# Patient Record
Sex: Male | Born: 1945
Health system: Southern US, Community
[De-identification: ages and names within clinical notes are randomized; demographics above are authoritative.]

## PROBLEM LIST (undated history)

## (undated) DIAGNOSIS — K219 Gastro-esophageal reflux disease without esophagitis: Secondary | ICD-10-CM

## (undated) DIAGNOSIS — J449 Chronic obstructive pulmonary disease, unspecified: Secondary | ICD-10-CM

## (undated) DIAGNOSIS — N411 Chronic prostatitis: Secondary | ICD-10-CM

## (undated) DIAGNOSIS — M109 Gout, unspecified: Secondary | ICD-10-CM

## (undated) DIAGNOSIS — J45909 Unspecified asthma, uncomplicated: Secondary | ICD-10-CM

## (undated) DIAGNOSIS — I10 Essential (primary) hypertension: Secondary | ICD-10-CM

## (undated) DIAGNOSIS — N400 Enlarged prostate without lower urinary tract symptoms: Secondary | ICD-10-CM

## (undated) DIAGNOSIS — R972 Elevated prostate specific antigen [PSA]: Secondary | ICD-10-CM

## (undated) DIAGNOSIS — I671 Cerebral aneurysm, nonruptured: Secondary | ICD-10-CM

## (undated) HISTORY — DX: Essential (primary) hypertension: I10

## (undated) HISTORY — DX: Gastro-esophageal reflux disease without esophagitis: K21.9

## (undated) HISTORY — DX: Elevated prostate specific antigen (PSA): R97.20

## (undated) HISTORY — DX: Cerebral aneurysm, nonruptured: I67.1

## (undated) HISTORY — DX: Chronic prostatitis: N41.1

## (undated) HISTORY — PX: LUNG SURGERY: SHX703

## (undated) HISTORY — DX: Benign prostatic hyperplasia without lower urinary tract symptoms: N40.0

## (undated) HISTORY — DX: Unspecified asthma, uncomplicated: J45.909

## (undated) HISTORY — PX: BRAIN SURGERY: SHX531

## (undated) HISTORY — PX: CARDIAC CATHETERIZATION: SHX172

---

## 2006-07-01 ENCOUNTER — Emergency Department: Payer: Self-pay

## 2007-05-19 ENCOUNTER — Emergency Department: Payer: Self-pay | Admitting: Emergency Medicine

## 2007-05-19 ENCOUNTER — Other Ambulatory Visit: Payer: Self-pay

## 2007-06-14 ENCOUNTER — Ambulatory Visit: Payer: Self-pay | Admitting: Gastroenterology

## 2008-04-12 ENCOUNTER — Ambulatory Visit: Payer: Self-pay | Admitting: Cardiology

## 2010-08-26 ENCOUNTER — Ambulatory Visit: Payer: Self-pay | Admitting: Gastroenterology

## 2012-03-18 ENCOUNTER — Emergency Department: Payer: Self-pay | Admitting: Emergency Medicine

## 2015-08-27 ENCOUNTER — Encounter: Payer: Self-pay | Admitting: *Deleted

## 2015-09-06 ENCOUNTER — Encounter: Payer: Self-pay | Admitting: Urology

## 2015-09-06 ENCOUNTER — Ambulatory Visit: Payer: Self-pay | Admitting: Urology

## 2015-09-13 ENCOUNTER — Ambulatory Visit (INDEPENDENT_AMBULATORY_CARE_PROVIDER_SITE_OTHER): Payer: Medicare Other | Admitting: Obstetrics and Gynecology

## 2015-09-13 ENCOUNTER — Encounter: Payer: Self-pay | Admitting: Obstetrics and Gynecology

## 2015-09-13 VITALS — BP 130/76 | HR 60 | Ht 69.0 in | Wt 190.6 lb

## 2015-09-13 DIAGNOSIS — R972 Elevated prostate specific antigen [PSA]: Secondary | ICD-10-CM | POA: Diagnosis not present

## 2015-09-13 DIAGNOSIS — K219 Gastro-esophageal reflux disease without esophagitis: Secondary | ICD-10-CM | POA: Insufficient documentation

## 2015-09-13 DIAGNOSIS — J449 Chronic obstructive pulmonary disease, unspecified: Secondary | ICD-10-CM | POA: Insufficient documentation

## 2015-09-13 DIAGNOSIS — N400 Enlarged prostate without lower urinary tract symptoms: Secondary | ICD-10-CM | POA: Diagnosis not present

## 2015-09-13 LAB — URINALYSIS, COMPLETE
BILIRUBIN UA: NEGATIVE
Glucose, UA: NEGATIVE
Ketones, UA: NEGATIVE
LEUKOCYTES UA: NEGATIVE
Nitrite, UA: NEGATIVE
PH UA: 5 (ref 5.0–7.5)
Protein, UA: NEGATIVE
RBC UA: NEGATIVE
Specific Gravity, UA: 1.015 (ref 1.005–1.030)
UUROB: 0.2 mg/dL (ref 0.2–1.0)

## 2015-09-13 LAB — MICROSCOPIC EXAMINATION
Bacteria, UA: NONE SEEN
RBC, UA: NONE SEEN /hpf (ref 0–?)
Renal Epithel, UA: NONE SEEN /hpf

## 2015-09-13 LAB — BLADDER SCAN AMB NON-IMAGING: SCAN RESULT: 111

## 2015-09-13 NOTE — Progress Notes (Signed)
Bladder Scan Patient  void: 111 ml Performed By: Pervis Hocking, CMA

## 2015-09-13 NOTE — Progress Notes (Signed)
09/13/2015 8:40 AM   Anthony Meyers September 29, 1946 [id number]  Referring provider: Maryland Pink, MD [address] [place], [place] [address]  Chief Complaint  Patient presents with  . Elevated PSA    HPI: Patient is a 69 year old male with a history of elevated PSA an BPH presenting today for his annual follow-up appointment.  His last appointment was 09/06/15 with Dr. Elnoria Howard. His PSA at that time was 7.4. Per Dr. Guinevere Ferrari last note patient was to have 4k score drawn if PSA was above 5.  It is unclear why patient did not have this done.  He does have a history of negative prostate biopsy on 08/22/13 for a PSA of 4.15.  No urinary complaints.  PSA History: 09/05/14 PSA 7.4 08/22/13 Negative prostate biopsy 07/20/13 PSA 4.15   PMH: Past Medical History  Diagnosis Date  . Brain aneurysm   . Asthma   . HTN (hypertension)   . GERD (gastroesophageal reflux disease)   . BPH (benign prostatic hypertrophy)   . Elevated PSA   . Chronic prostatitis     Surgical History: Past Surgical History  Procedure Laterality Date  . Lung surgery Left   . Brain surgery      Home Medications:    Medication List       This list is accurate as of: 09/13/15 11:59 PM.  Always use your most recent med list.               aspirin EC 81 MG tablet  Take 81 mg by mouth daily.     budesonide-formoterol 160-4.5 MCG/ACT inhaler  Commonly known as:  SYMBICORT  Inhale 2 puffs into the lungs 2 (two) times daily.     esomeprazole 40 MG capsule  Commonly known as:  NEXIUM  Take 40 mg by mouth daily at 12 noon.     hydrochlorothiazide 25 MG tablet  Commonly known as:  HYDRODIURIL  Take 25 mg by mouth daily.     INCRUSE ELLIPTA 62.5 MCG/INH Aepb  Generic drug:  Umeclidinium Bromide  INHALE 1 PUFFS BY MOUTH ONCE DAILY     potassium chloride SA 20 MEQ tablet  Commonly known as:  K-DUR,KLOR-CON     tiotropium 18 MCG inhalation capsule  Commonly known as:  SPIRIVA  Place 18 mcg  into inhaler and inhale daily.        Allergies: No Known Allergies  Family History: Family History  Problem Relation Age of Onset  . Throat cancer Brother   . Stroke Father   . Diabetes Mellitus II Father   . Prostate cancer Brother   . Hypertension Father     Social History:  reports that he quit smoking about 20 years ago. He does not have any smokeless tobacco history on file. He reports that he does not drink alcohol. His drug history is not on file.  ROS: UROLOGY Frequent Urination?: No Hard to postpone urination?: No Burning/pain with urination?: No Get up at night to urinate?: No Leakage of urine?: No Urine stream starts and stops?: No Trouble starting stream?: No Do you have to strain to urinate?: No Blood in urine?: No Urinary tract infection?: No Sexually transmitted disease?: No Injury to kidneys or bladder?: No Painful intercourse?: No Weak stream?: No Erection problems?: No Penile pain?: No  Gastrointestinal Nausea?: No Vomiting?: No Indigestion/heartburn?: No Diarrhea?: No Constipation?: No  Constitutional Fever: No Night sweats?: No Weight loss?: No Fatigue?: No  Skin Skin rash/lesions?: No Itching?: Yes  Eyes Blurred vision?: No Double vision?: No  Ears/Nose/Throat Sore throat?: No Sinus problems?: No  Hematologic/Lymphatic Swollen glands?: No Easy bruising?: No  Cardiovascular Leg swelling?: No Chest pain?: No  Respiratory Cough?: Yes Shortness of breath?: Yes  Endocrine Excessive thirst?: No  Musculoskeletal Back pain?: No Joint pain?: No  Neurological Headaches?: No Dizziness?: No  Psychologic Depression?: No Anxiety?: No  Physical Exam: BP 130/76 mmHg  Pulse 60  Ht 5\' 9"  (1.753 m)  Wt 190 lb 9.6 oz (86.456 kg)  BMI 28.13 kg/m2  Constitutional:  Alert and oriented, No acute distress. HEENT: [place] AT, moist mucus membranes.  Trachea midline, no masses. Cardiovascular: No clubbing, cyanosis, or  edema. Respiratory: Normal respiratory effort, no increased work of breathing. GI: Abdomen is soft, nontender, nondistended, no abdominal masses GU: No CVA tenderness.  DRE: prostate +2-3 with firmness and slightly irregularity noted on right side Skin: No rashes, bruises or suspicious lesions. Lymph: No cervical or inguinal adenopathy. Neurologic: Grossly intact, no focal deficits, moving all 4 extremities. Psychiatric: Normal mood and affect.  Laboratory Data: No results found for: WBC, HGB, HCT, MCV, PLT  No results found for: CREATININE  Lab Results  Component Value Date   PSA 5.3* 09/13/2015    No results found for: TESTOSTERONE  No results found for: HGBA1C  Urinalysis    Component Value Date/Time   GLUCOSEU Negative 09/13/2015 1622   BILIRUBINUR Negative 09/13/2015 1622   NITRITE Negative 09/13/2015 1622   LEUKOCYTESUR Negative 09/13/2015 1622    Pertinent Imaging:  Assessment & Plan:    1. Elevated PSA- PSA drawn today. If PSA greater than 5 we'll proceed with initial plan to have 4K score drawn.  DRE remarkable for enlarged prostate with slight right-sided irregularity. Patient has a history of negative prostate biopsy in 2014.  We will begin biannual screening with PSA and DRE pending today's PSA results.  2 BPH-  PVR 111 mls today. Patient denies any urinary complaints including intermittency, hesitancy, frequency or sensation of incomplete bladder emptying. We will continue to monitor and treat symptoms should they develop.  There are no diagnoses linked to this encounter.  Return in about 6 months (around 03/13/2016) for recheck DRE/PSA.  Herbert Moors, [facility] [address], [place], [place] [address] [phone]

## 2015-09-14 ENCOUNTER — Telehealth: Payer: Self-pay

## 2015-09-14 LAB — PSA: Prostate Specific Ag, Serum: 5.3 ng/mL — ABNORMAL HIGH (ref 0.0–4.0)

## 2015-09-14 NOTE — Telephone Encounter (Signed)
Spoke with pt in reference to PSA levels. Pt will RTC Monday for 4K score lab draw.

## 2015-09-14 NOTE — Telephone Encounter (Signed)
-----   Message from Roda Shutters, [place] sent at 09/14/2015  9:04 AM EDT ----- Please notify patient that his PSA was slightly elevated from what it was when he had the previous negative prostate biopsy. I would like for him to come in and have a 4k score drawn as we spoke about during his last appointment. Please order this and have him scheduled for a lab draw. Thanks

## 2015-09-17 ENCOUNTER — Encounter: Payer: Self-pay | Admitting: Obstetrics and Gynecology

## 2015-09-17 ENCOUNTER — Ambulatory Visit: Payer: Medicare Other

## 2015-09-17 DIAGNOSIS — R972 Elevated prostate specific antigen [PSA]: Secondary | ICD-10-CM

## 2015-09-17 NOTE — Progress Notes (Signed)
Pt came in today for 4K Score lab draw. 2 tiger tubes were drawn from pt right ac. Pt tolerated well. No s/s of adverse reaction noted. Blood spun, packaged, and sent via FedEx to [facility].

## 2015-09-20 ENCOUNTER — Telehealth: Payer: Self-pay | Admitting: Obstetrics and Gynecology

## 2015-09-20 NOTE — Telephone Encounter (Signed)
Please notify patient that we received his 4K results. He has approximately only a 2% chance of having aggressive prostate cancer if we performed a prostate biopsy. This is very low risk. It is reassuring that he does not have high-risk prostate cancer. He needs to keep his follow-up appointment as scheduled.  thanks

## 2015-09-21 ENCOUNTER — Other Ambulatory Visit: Payer: Self-pay | Admitting: Obstetrics and Gynecology

## 2015-09-21 NOTE — Telephone Encounter (Signed)
No answer

## 2015-09-24 NOTE — Telephone Encounter (Signed)
Spoke with pt in reference to 4K Score results. Pt voiced understanding.

## 2015-12-04 ENCOUNTER — Ambulatory Visit: Payer: Medicare Other | Admitting: Anesthesiology

## 2015-12-04 ENCOUNTER — Encounter
Admission: RE | Disposition: A | Discharge status: Home / Self Care | Payer: Self-pay | Source: Ambulatory Visit | Attending: Gastroenterology

## 2015-12-04 ENCOUNTER — Encounter: Payer: Self-pay | Admitting: Anesthesiology

## 2015-12-04 ENCOUNTER — Ambulatory Visit
Admission: RE | Admit: 2015-12-04 | Discharge: 2015-12-04 | Disposition: A | Discharge status: Home / Self Care | Payer: Medicare Other | Source: Ambulatory Visit | Attending: Gastroenterology | Admitting: Gastroenterology

## 2015-12-04 DIAGNOSIS — Z87891 Personal history of nicotine dependence: Secondary | ICD-10-CM | POA: Diagnosis not present

## 2015-12-04 DIAGNOSIS — I10 Essential (primary) hypertension: Secondary | ICD-10-CM | POA: Insufficient documentation

## 2015-12-04 DIAGNOSIS — K640 First degree hemorrhoids: Secondary | ICD-10-CM | POA: Insufficient documentation

## 2015-12-04 DIAGNOSIS — Z79899 Other long term (current) drug therapy: Secondary | ICD-10-CM | POA: Diagnosis not present

## 2015-12-04 DIAGNOSIS — K5730 Diverticulosis of large intestine without perforation or abscess without bleeding: Secondary | ICD-10-CM | POA: Insufficient documentation

## 2015-12-04 DIAGNOSIS — Z1211 Encounter for screening for malignant neoplasm of colon: Secondary | ICD-10-CM | POA: Insufficient documentation

## 2015-12-04 DIAGNOSIS — J449 Chronic obstructive pulmonary disease, unspecified: Secondary | ICD-10-CM | POA: Diagnosis not present

## 2015-12-04 DIAGNOSIS — Z7982 Long term (current) use of aspirin: Secondary | ICD-10-CM | POA: Diagnosis not present

## 2015-12-04 DIAGNOSIS — K219 Gastro-esophageal reflux disease without esophagitis: Secondary | ICD-10-CM | POA: Diagnosis not present

## 2015-12-04 DIAGNOSIS — Z7951 Long term (current) use of inhaled steroids: Secondary | ICD-10-CM | POA: Insufficient documentation

## 2015-12-04 HISTORY — PX: COLONOSCOPY WITH PROPOFOL: SHX5780

## 2015-12-04 SURGERY — COLONOSCOPY WITH PROPOFOL
Anesthesia: General

## 2015-12-04 MED ORDER — EPHEDRINE SULFATE 50 MG/ML IJ SOLN
INTRAMUSCULAR | Status: DC | PRN
  Administered 2015-12-04: 5 mg via INTRAVENOUS

## 2015-12-04 MED ORDER — FENTANYL CITRATE (PF) 100 MCG/2ML IJ SOLN
INTRAMUSCULAR | Status: DC | PRN
  Administered 2015-12-04: 50 ug via INTRAVENOUS

## 2015-12-04 MED ORDER — PROPOFOL 500 MG/50ML IV EMUL
INTRAVENOUS | Status: DC | PRN
  Administered 2015-12-04: 120 ug/kg/min via INTRAVENOUS

## 2015-12-04 MED ORDER — MIDAZOLAM HCL 2 MG/2ML IJ SOLN
INTRAMUSCULAR | Status: DC | PRN
  Administered 2015-12-04: 1 mg via INTRAVENOUS

## 2015-12-04 MED ORDER — SODIUM CHLORIDE 0.9 % IV SOLN
INTRAVENOUS | Status: DC
  Administered 2015-12-04: 1000 mL via INTRAVENOUS

## 2015-12-04 MED ORDER — SODIUM CHLORIDE 0.9 % IV SOLN: INTRAVENOUS | Status: DC

## 2015-12-04 NOTE — H&P (Signed)
Outpatient short stay form Pre-procedure 12/04/2015 2:01 PM Lollie Sails MD  Primary Physician: Dr Maryland Pink  Reason for visit:  Colonoscopy  History of present illness:  Patient is a 69 year old male presenting today for a colonoscopy. He has a personal history of adenomatous colon polyps. Last colonoscopy was 5 years ago. He tolerated his prep well. He takes no attending agents and held his 81 mg aspirin for several days.    Current facility-administered medications:  .  0.9 %  sodium chloride infusion, , Intravenous, Continuous, Lollie Sails, MD, Last Rate: 20 mL/hr at 12/04/15 1327, 1,000 mL at 12/04/15 1327 .  0.9 %  sodium chloride infusion, , Intravenous, Continuous, Lollie Sails, MD  Prescriptions prior to admission  Medication Sig Dispense Refill Last Dose  . aspirin EC 81 MG tablet Take 81 mg by mouth daily.   Past Week at Unknown time  . budesonide-formoterol (SYMBICORT) 160-4.5 MCG/ACT inhaler Inhale 2 puffs into the lungs 2 (two) times daily.   12/04/2015 at 730  . esomeprazole (NEXIUM) 40 MG capsule Take 40 mg by mouth daily at 12 noon.   12/03/2015 at 730  . hydrochlorothiazide (HYDRODIURIL) 25 MG tablet Take 25 mg by mouth daily.   12/04/2015 at 900  . potassium chloride SA (K-DUR,KLOR-CON) 20 MEQ tablet    12/03/2015 at 730  . tiotropium (SPIRIVA) 18 MCG inhalation capsule Place 18 mcg into inhaler and inhale daily.   12/03/2015 at 730  . Umeclidinium Bromide (INCRUSE ELLIPTA) 62.5 MCG/INH AEPB INHALE 1 PUFFS BY MOUTH ONCE DAILY   Past Week at Unknown time     No Known Allergies   Past Medical History  Diagnosis Date  . Brain aneurysm   . Asthma   . HTN (hypertension)   . GERD (gastroesophageal reflux disease)   . BPH (benign prostatic hypertrophy)   . Elevated PSA   . Chronic prostatitis     Review of systems:      Physical Exam    Heart and lungs: Regular rate and rhythm without rub or gallop, lungs are bilaterally clear.   HEENT: Normocephalic atraumatic eyes are anicteric    Other:     Pertinant exam for procedure: Off nontender nondistended bowel sounds positive normoactive.    Planned proceedures: Colonoscopy and indicated procedures. I have discussed the risks benefits and complications of procedures to include not limited to bleeding, infection, perforation and the risk of sedation and the patient wishes to proceed.    Lollie Sails, MD Gastroenterology 12/04/2015  2:01 PM

## 2015-12-04 NOTE — Anesthesia Postprocedure Evaluation (Signed)
Anesthesia Post Note  Patient: Anthony Meyers  Procedure(s) Performed: Procedure(s) (LRB): COLONOSCOPY WITH PROPOFOL (N/A)  Patient location during evaluation: PACU Anesthesia Type: General Level of consciousness: awake and alert Pain management: satisfactory to patient Vital Signs Assessment: post-procedure vital signs reviewed and stable Respiratory status: spontaneous breathing Cardiovascular status: stable Anesthetic complications: no    Last Vitals:  Filed Vitals:   12/04/15 1310  BP: 128/73  Pulse: 72  Temp: 36.2 C  Resp: 20    Last Pain: There were no vitals filed for this visit.               VAN STAVEREN,Danilyn Cocke

## 2015-12-04 NOTE — Op Note (Signed)
[facility] Gastroenterology Patient Name: Anthony Meyers Procedure Date: 12/04/2015 2:00 PM MRN: [id number] Account #: [id number] Date of Birth: 1946/02/27 Admit Type: Outpatient Age: 69 Room: [facility] ENDO ROOM 3 Gender: Male Note Status: Finalized Procedure:         Colonoscopy Indications:       Personal history of colonic polyps Providers:         Lollie Sails, MD Referring MD:      Irven Easterly. Kary Kos, MD (Referring MD) Medicines:         Monitored Anesthesia Care Complications:     No immediate complications. Procedure:         Pre-Anesthesia Assessment:                    - ASA Grade Assessment: III - A patient with severe                     systemic disease.                    After obtaining informed consent, the colonoscope was                     passed under direct vision. Throughout the procedure, the                     patient's blood pressure, pulse, and oxygen saturations                     were monitored continuously. The Colonoscope was                     introduced through the anus and advanced to the the cecum,                     identified by appendiceal orifice and ileocecal valve. The                     colonoscopy was performed with moderate difficulty due to                     a tortuous colon. The patient tolerated the procedure                     well. The quality of the bowel preparation was good. Findings:      Multiple medium-mouthed diverticula were found in the sigmoid colon and       in the descending colon.      Non-bleeding internal hemorrhoids were found during retroflexion and       during anoscopy. The hemorrhoids were Grade I (internal hemorrhoids that       do not prolapse).      The exam was otherwise without abnormality.      The digital rectal exam was normal. Impression:        - Diverticulosis in the sigmoid colon and in the                     descending colon.                    - Non-bleeding internal  hemorrhoids.                    - The examination was otherwise normal.                    -  No specimens collected. Recommendation:    - Repeat colonoscopy in 5 years for surveillance. Procedure Code(s): --- Professional ---                    [phone], Colonoscopy, flexible; diagnostic, including                     collection of specimen(s) by brushing or washing, when                     performed (separate procedure) Diagnosis Code(s): --- Professional ---                    K64.0, First degree hemorrhoids                    Z86.010, Personal history of colonic polyps                    K57.30, Diverticulosis of large intestine without                     perforation or abscess without bleeding CPT copyright 2014 American Medical Association. All rights reserved. The codes documented in this report are preliminary and upon coder review may  be revised to meet current compliance requirements. Lollie Sails, MD 12/04/2015 2:32:57 PM This report has been signed electronically. Number of Addenda: 0 Note Initiated On: 12/04/2015 2:00 PM Scope Withdrawal Time: 0 hours 5 minutes 59 seconds  Total Procedure Duration: 0 hours 18 minutes 35 seconds       [facility]

## 2015-12-04 NOTE — Transfer of Care (Signed)
Immediate Anesthesia Transfer of Care Note  Patient: Anthony Meyers  Procedure(s) Performed: Procedure(s): COLONOSCOPY WITH PROPOFOL (N/A)  Patient Location: PACU  Anesthesia Type:General  Level of Consciousness: awake and sedated  Airway & Oxygen Therapy: Patient Spontanous Breathing and Patient connected to nasal cannula oxygen  Post-op Assessment: Report given to RN and Post -op Vital signs reviewed and stable  Post vital signs: Reviewed and stable  Last Vitals:  Filed Vitals:   12/04/15 1310  BP: 128/73  Pulse: 72  Temp: 36.2 C  Resp: 20    Complications: No apparent anesthesia complications

## 2015-12-04 NOTE — Anesthesia Preprocedure Evaluation (Addendum)
Anesthesia Evaluation  Patient identified by MRN, date of birth, ID band Patient awake    Reviewed: Allergy & Precautions  Airway Mallampati: II       Dental  (+) Upper Dentures, Partial Lower   Pulmonary COPD, former smoker,     + decreased breath sounds      Cardiovascular hypertension, Pt. on medications  Rhythm:Regular     Neuro/Psych    GI/Hepatic Neg liver ROS, GERD  ,  Endo/Other  negative endocrine ROS  Renal/GU negative Renal ROS     Musculoskeletal negative musculoskeletal ROS (+)   Abdominal Normal abdominal exam  (+)   Peds  Hematology negative hematology ROS (+)   Anesthesia Other Findings   Reproductive/Obstetrics                            Anesthesia Physical Anesthesia Plan  ASA: III  Anesthesia Plan: General   Post-op Pain Management:    Induction: Intravenous  Airway Management Planned: Nasal Cannula  Additional Equipment:   Intra-op Plan:   Post-operative Plan:   Informed Consent: I have reviewed the patients History and Physical, chart, labs and discussed the procedure including the risks, benefits and alternatives for the proposed anesthesia with the patient or authorized representative who has indicated his/her understanding and acceptance.     Plan Discussed with: CRNA  Anesthesia Plan Comments:        Anesthesia Quick Evaluation

## 2015-12-04 NOTE — Anesthesia Procedure Notes (Signed)
Performed by: COOK-MARTIN, Terrian Ridlon Pre-anesthesia Checklist: Patient identified, Emergency Drugs available, Suction available, Patient being monitored and Timeout performed Patient Re-evaluated:Patient Re-evaluated prior to inductionOxygen Delivery Method: Nasal cannula Preoxygenation: Pre-oxygenation with 100% oxygen Intubation Type: IV induction Placement Confirmation: positive ETCO2 and CO2 detector       

## 2015-12-05 ENCOUNTER — Encounter: Payer: Self-pay | Admitting: Gastroenterology

## 2016-03-05 DIAGNOSIS — Z125 Encounter for screening for malignant neoplasm of prostate: Secondary | ICD-10-CM | POA: Diagnosis not present

## 2016-03-05 DIAGNOSIS — K219 Gastro-esophageal reflux disease without esophagitis: Secondary | ICD-10-CM | POA: Diagnosis not present

## 2016-03-05 DIAGNOSIS — I10 Essential (primary) hypertension: Secondary | ICD-10-CM | POA: Diagnosis not present

## 2016-03-05 DIAGNOSIS — J449 Chronic obstructive pulmonary disease, unspecified: Secondary | ICD-10-CM | POA: Diagnosis not present

## 2016-03-13 ENCOUNTER — Encounter: Payer: Self-pay | Admitting: Urology

## 2016-03-13 ENCOUNTER — Ambulatory Visit (INDEPENDENT_AMBULATORY_CARE_PROVIDER_SITE_OTHER): Payer: Medicare Other | Admitting: Urology

## 2016-03-13 VITALS — BP 114/57 | HR 58 | Ht 69.0 in | Wt 184.1 lb

## 2016-03-13 DIAGNOSIS — N401 Enlarged prostate with lower urinary tract symptoms: Secondary | ICD-10-CM

## 2016-03-13 DIAGNOSIS — N138 Other obstructive and reflux uropathy: Secondary | ICD-10-CM

## 2016-03-13 DIAGNOSIS — R972 Elevated prostate specific antigen [PSA]: Secondary | ICD-10-CM

## 2016-03-13 DIAGNOSIS — N400 Enlarged prostate without lower urinary tract symptoms: Secondary | ICD-10-CM | POA: Diagnosis not present

## 2016-03-13 NOTE — Progress Notes (Signed)
10:26 AM   Anthony Meyers [birth year] [id number]  Referring provider: Maryland Pink, MD [address] [facility] [place], [place] [address]  Chief Complaint  Patient presents with  . Benign Prostatic Hypertrophy    6 month follow up  . Elevated PSA    HPI: Patient is a 70 year old African-American male with a history of elevated PSA an BPH presenting today for his biannual follow-up appointment.    His last appointment with Dr. Elnoria Howard. His PSA at that time was 7.4. Per Dr. Guinevere Ferrari last note patient was to have 4k score drawn if PSA was above 5.  It is unclear why patient did not have this done.  He does have a history of negative prostate biopsy on 08/22/13 for a PSA of 4.15.  His most recent PSA was 5.3 on 09/13/2015.  His 4K score at that time was 2%.  No urinary complaints.  PSA History: 09/05/14 PSA 7.4 08/22/13 Negative prostate biopsy 07/20/13 PSA 4.15  IPSS score is 2/2.      IPSS      03/13/16 0900       International Prostate Symptom Score   How often have you had the sensation of not emptying your bladder? Not at All     How often have you had to urinate less than every two hours? Not at All     How often have you found you stopped and started again several times when you urinated? Not at All     How often have you found it difficult to postpone urination? Not at All     How often have you had a weak urinary stream? Not at All     How often have you had to strain to start urination? Not at All     How many times did you typically get up at night to urinate? 2 Times     Total IPSS Score 2     Quality of Life due to urinary symptoms   If you were to spend the rest of your life with your urinary condition just the way it is now how would you feel about that? Mostly Satisfied        Score:  1-7 Mild 8-19 Moderate 20-35 Severe  PMH: Past Medical History  Diagnosis Date  . Brain aneurysm   . Asthma   . HTN (hypertension)   . GERD (gastroesophageal  reflux disease)   . BPH (benign prostatic hypertrophy)   . Elevated PSA   . Chronic prostatitis     Surgical History: Past Surgical History  Procedure Laterality Date  . Lung surgery Left   . Brain surgery    . Colonoscopy with propofol N/A 12/04/2015    Procedure: COLONOSCOPY WITH PROPOFOL;  Surgeon: Lollie Sails, MD;  Location[facility] ENDOSCOPY;  Service: Endoscopy;  Laterality: N/A;    Home Medications:    Medication List       This list is accurate as of: 03/13/16 10:26 AM.  Always use your most recent med list.               aspirin EC 81 MG tablet  Take 81 mg by mouth daily.     budesonide-formoterol 160-4.5 MCG/ACT inhaler  Commonly known as:  SYMBICORT  Inhale 2 puffs into the lungs 2 (two) times daily.     esomeprazole 40 MG capsule  Commonly known as:  NEXIUM  Take 40 mg by mouth daily at 12 noon.  hydrochlorothiazide 25 MG tablet  Commonly known as:  HYDRODIURIL  Take 25 mg by mouth daily.     INCRUSE ELLIPTA 62.5 MCG/INH Aepb  Generic drug:  umeclidinium bromide  INHALE 1 PUFFS BY MOUTH ONCE DAILY     potassium chloride SA 20 MEQ tablet  Commonly known as:  K-DUR,KLOR-CON     tiotropium 18 MCG inhalation capsule  Commonly known as:  SPIRIVA  Place 18 mcg into inhaler and inhale daily.        Allergies: No Known Allergies  Family History: Family History  Problem Relation Age of Onset  . Throat cancer Brother   . Stroke Father   . Diabetes Mellitus II Father   . Prostate cancer Brother   . Hypertension Father   . Kidney disease Neg Hx     Social History:  reports that he quit smoking about 20 years ago. He does not have any smokeless tobacco history on file. He reports that he does not drink alcohol or use illicit drugs.  ROS: UROLOGY Frequent Urination?: No Hard to postpone urination?: No Burning/pain with urination?: No Get up at night to urinate?: No Leakage of urine?: No Urine stream starts and stops?: No Trouble starting  stream?: No Do you have to strain to urinate?: No Blood in urine?: No Urinary tract infection?: No Sexually transmitted disease?: No Injury to kidneys or bladder?: No Painful intercourse?: No Weak stream?: No Erection problems?: No Penile pain?: No  Gastrointestinal Nausea?: No Vomiting?: No Indigestion/heartburn?: No Diarrhea?: No Constipation?: No  Constitutional Fever: No Night sweats?: No Weight loss?: No Fatigue?: No  Skin Skin rash/lesions?: No Itching?: No  Eyes Blurred vision?: No Double vision?: No  Ears/Nose/Throat Sore throat?: No Sinus problems?: No  Hematologic/Lymphatic Swollen glands?: No Easy bruising?: No  Cardiovascular Leg swelling?: No Chest pain?: No  Respiratory Cough?: No Shortness of breath?: No  Endocrine Excessive thirst?: No  Musculoskeletal Back pain?: No Joint pain?: No  Neurological Headaches?: No Dizziness?: No  Psychologic Depression?: No Anxiety?: No  Physical Exam: BP 114/57 mmHg  Pulse 58  Ht 5\' 9"  (1.753 m)  Wt 184 lb 1.6 oz (83.507 kg)  BMI 27.17 kg/m2  GU: No CVA tenderness.  No bladder fullness or masses.  Patient with uncircumcised phallus. Foreskin easily retracted  Urethral meatus is patent.  No penile discharge. No penile lesions or rashes. Scrotum without lesions, cysts, rashes and/or edema.  Testicles are located scrotally bilaterally. No masses are appreciated in the testicles. Left and right epididymis are normal. Rectal: Patient with  normal sphincter tone. Anus and perineum without scarring or rashes. No rectal masses are appreciated. Prostate is approximately 50 grams, slightly firmer in the right lobe, no nodules are appreciated. Seminal vesicles are normal.   Laboratory Data: PSA History 09/13/2015 PSA 5.3; 4 K score 2% 09/05/14 PSA 7.4 08/22/13 Negative prostate biopsy 07/20/13 PSA 4.15    Assessment & Plan:    1. Elevated PSA- PSA drawn today.  DRE remarkable for enlarged prostate  with slight right-sided irregularity. Patient has a history of negative prostate biopsy in 2014.  We will continue biannual screening with PSA and DRE pending today's PSA results.  2 BPH with LUTS:    IPSS score is 2/2.  We will continue to monitor.  He will have his IPSS score, exam and PSA in 6 months.   Return in about 6 months (around 09/12/2016) for IPSS and exam.  Zara Council, PA-C  [facility] [address], [address]  [place], [place] [address] [phone]

## 2016-03-14 LAB — PSA: Prostate Specific Ag, Serum: 6.1 ng/mL — ABNORMAL HIGH (ref 0.0–4.0)

## 2016-03-17 ENCOUNTER — Telehealth: Payer: Self-pay

## 2016-03-17 NOTE — Telephone Encounter (Signed)
-----   Message from Nori Riis, PA-C sent at 03/14/2016  3:23 PM EDT ----- Would you add a free and total PSA to his blood work?

## 2016-03-17 NOTE — Telephone Encounter (Signed)
Spoke with Loma Sousa at [employer] were added.

## 2016-03-18 LAB — PSA, TOTAL AND FREE
PSA, Free Pct: 11.6 %
PSA, Free: 0.73 ng/mL
Prostate Specific Ag, Serum: 6.3 ng/mL — ABNORMAL HIGH (ref 0.0–4.0)

## 2016-03-18 LAB — SPECIMEN STATUS REPORT

## 2016-03-25 ENCOUNTER — Telehealth: Payer: Self-pay

## 2016-03-25 NOTE — Telephone Encounter (Signed)
Pt called back saying he doesn't want to have the biopsy. He'd rather have "a TT something." I asked Vikki Ports if she knew what he was talking about and she's unsure. He wants Larene Beach to know that he'd rather have the alternative option and he can't have it on Monday. Please give him a call regarding this.  Pt's ph# [phone] Thank you.

## 2016-03-25 NOTE — Telephone Encounter (Signed)
Spoke with pt in reference to blood work results. Pt did want to proceed with biopsy. Should we go ahead and proceed with scheduling?

## 2016-03-25 NOTE — Telephone Encounter (Signed)
-----   Message from Nori Riis, PA-C sent at 03/24/2016  1:17 PM EDT ----- Patient's blood work indicates that he has a 35% probability of having prostate cancer.  Could elect to undergo another biopsy at this time before we can schedule MRI of the prostate.

## 2016-03-25 NOTE — Telephone Encounter (Signed)
Does he mean the MRI?

## 2016-03-27 NOTE — Telephone Encounter (Signed)
No answer

## 2016-04-04 NOTE — Telephone Encounter (Signed)
No answer

## 2016-09-10 DIAGNOSIS — Z23 Encounter for immunization: Secondary | ICD-10-CM | POA: Diagnosis not present

## 2016-09-10 DIAGNOSIS — J449 Chronic obstructive pulmonary disease, unspecified: Secondary | ICD-10-CM | POA: Diagnosis not present

## 2016-09-10 DIAGNOSIS — I10 Essential (primary) hypertension: Secondary | ICD-10-CM | POA: Diagnosis not present

## 2016-09-10 DIAGNOSIS — Z0000 Encounter for general adult medical examination without abnormal findings: Secondary | ICD-10-CM | POA: Diagnosis not present

## 2016-09-10 DIAGNOSIS — K219 Gastro-esophageal reflux disease without esophagitis: Secondary | ICD-10-CM | POA: Diagnosis not present

## 2016-09-15 ENCOUNTER — Other Ambulatory Visit: Payer: Medicare Other

## 2016-09-17 ENCOUNTER — Ambulatory Visit (INDEPENDENT_AMBULATORY_CARE_PROVIDER_SITE_OTHER): Payer: Medicare Other | Admitting: Urology

## 2016-09-17 ENCOUNTER — Encounter: Payer: Self-pay | Admitting: Urology

## 2016-09-17 VITALS — BP 134/67 | HR 72 | Ht 69.0 in | Wt 185.6 lb

## 2016-09-17 DIAGNOSIS — N138 Other obstructive and reflux uropathy: Secondary | ICD-10-CM

## 2016-09-17 DIAGNOSIS — N401 Enlarged prostate with lower urinary tract symptoms: Secondary | ICD-10-CM

## 2016-09-17 DIAGNOSIS — R972 Elevated prostate specific antigen [PSA]: Secondary | ICD-10-CM

## 2016-09-17 NOTE — Progress Notes (Signed)
10:22 AM   Anthony Meyers [birth year] [id number]  Referring provider: Maryland Pink, MD [address] [facility] [place], [place] [address]  Chief Complaint  Patient presents with  . Benign Prostatic Hypertrophy    6 month follow up  . Elevated PSA    HPI: Patient is a 70 year old African-American male with a history of elevated PSA an BPH presenting today for his biannual follow-up appointment.    History of elevated PSA Patient underwent a prostate biopsy in 2014 for a PSA of 4.5.  Prostate biopsy was negative.  His brother has a history of prostate cancer.  His most recent PSA was 6.1 on 03/13/2016.     BPH WITH LUTS His IPSS score today is 1, which is mild lower urinary tract symptomatology. He is pleased with his quality life due to his urinary symptoms.   His previous IPSS score was 2/2.  He has no urinary complaints at this time.  He denies any dysuria, hematuria or suprapubic pain.   He also denies any recent fevers, chills, nausea or vomiting.      IPSS    Row Name 09/17/16 0900         International Prostate Symptom Score   How often have you had the sensation of not emptying your bladder? Not at All     How often have you had to urinate less than every two hours? Not at All     How often have you found you stopped and started again several times when you urinated? Not at All     How often have you found it difficult to postpone urination? Not at All     How often have you had a weak urinary stream? Not at All     How often have you had to strain to start urination? Not at All     How many times did you typically get up at night to urinate? 1 Time     Total IPSS Score 1       Quality of Life due to urinary symptoms   If you were to spend the rest of your life with your urinary condition just the way it is now how would you feel about that? Pleased        Score:  1-7 Mild 8-19 Moderate 20-35 Severe   PMH: Past Medical History:  Diagnosis Date    . Asthma   . BPH (benign prostatic hypertrophy)   . Brain aneurysm   . Chronic prostatitis   . Elevated PSA   . GERD (gastroesophageal reflux disease)   . HTN (hypertension)     Surgical History: Past Surgical History:  Procedure Laterality Date  . BRAIN SURGERY    . COLONOSCOPY WITH PROPOFOL N/A 12/04/2015   Procedure: COLONOSCOPY WITH PROPOFOL;  Surgeon: Lollie Sails, MD;  Location: [facility] ENDOSCOPY;  Service: Endoscopy;  Laterality: N/A;  . LUNG SURGERY Left     Home Medications:    Medication List       Accurate as of 09/17/16 10:22 AM. Always use your most recent med list.          aspirin EC 81 MG tablet Take 81 mg by mouth daily.   budesonide-formoterol 160-4.5 MCG/ACT inhaler Commonly known as:  SYMBICORT Inhale 2 puffs into the lungs 2 (two) times daily.   esomeprazole 40 MG capsule Commonly known as:  NEXIUM Take 40 mg by mouth daily at 12 noon.   hydrochlorothiazide 25 MG tablet  Commonly known as:  HYDRODIURIL Take 25 mg by mouth daily.   INCRUSE ELLIPTA 62.5 MCG/INH Aepb Generic drug:  umeclidinium bromide INHALE 1 PUFFS BY MOUTH ONCE DAILY   potassium chloride SA 20 MEQ tablet Commonly known as:  K-DUR,KLOR-CON   tiotropium 18 MCG inhalation capsule Commonly known as:  SPIRIVA Place 18 mcg into inhaler and inhale daily.       Allergies: No Known Allergies  Family History: Family History  Problem Relation Age of Onset  . Throat cancer Brother   . Stroke Father   . Diabetes Mellitus II Father   . Hypertension Father   . Prostate cancer Brother   . Kidney disease Neg Hx     Social History:  reports that he quit smoking about 21 years ago. He has never used smokeless tobacco. He reports that he does not drink alcohol or use drugs.  ROS: UROLOGY Frequent Urination?: No Hard to postpone urination?: No Burning/pain with urination?: No Get up at night to urinate?: No Leakage of urine?: No Urine stream starts and stops?:  No Trouble starting stream?: No Do you have to strain to urinate?: No Blood in urine?: No Urinary tract infection?: No Sexually transmitted disease?: No Injury to kidneys or bladder?: No Painful intercourse?: No Weak stream?: No Erection problems?: No Penile pain?: No  Gastrointestinal Nausea?: No Vomiting?: No Indigestion/heartburn?: No Diarrhea?: No Constipation?: No  Constitutional Fever: No Night sweats?: No Weight loss?: Yes Fatigue?: No  Skin Skin rash/lesions?: No Itching?: Yes  Eyes Blurred vision?: Yes Double vision?: No  Ears/Nose/Throat Sore throat?: No Sinus problems?: No  Hematologic/Lymphatic Swollen glands?: No Easy bruising?: No  Cardiovascular Leg swelling?: No Chest pain?: Yes  Respiratory Cough?: No Shortness of breath?: No  Endocrine Excessive thirst?: No  Musculoskeletal Back pain?: No Joint pain?: Yes  Neurological Headaches?: No Dizziness?: No  Psychologic Depression?: No Anxiety?: No  Physical Exam: BP 134/67   Pulse 72   Ht 5\' 9"  (1.753 m)   Wt 185 lb 9.6 oz (84.2 kg)   BMI 27.41 kg/m   GU: No CVA tenderness.  No bladder fullness or masses.  Patient with uncircumcised phallus. Foreskin easily retracted  Urethral meatus is patent.  No penile discharge. No penile lesions or rashes. Scrotum without lesions, cysts, rashes and/or edema.  Testicles are located scrotally bilaterally. No masses are appreciated in the testicles. Left and right epididymis are normal.  Right hydrocele was noted.   Rectal: Patient with  normal sphincter tone. Anus and perineum without scarring or rashes. No rectal masses are appreciated. Prostate is approximately 50 grams, no nodules are appreciated. Seminal vesicles are normal.   Laboratory Data: PSA History  4.15 ng/mL on 07/20/2013  Negative prostate biopsy  7.4 ng/mL on 09/05/2014  5.3 ng/mL on 09/13/2015  4K was 2 % on 09/13/2015  6.1 ng/mL on 03/13/2016    PSA, Free  0.73 - 35%  probability of prostate cancer  Assessment & Plan:    1. Elevated PSA  - see trend above  - PSA drawn today  2. BPH with LUTS  - IPSS score is 1/1, it is stable  - Continue conservative management, avoiding bladder irritants and timed voiding's  - RTC in 6 months for IPSS, PSA and exam   Return in about 6 months (around 03/18/2017) for IPSS and exam.  Zara Council, [facility]  [facility] [address], [place], [place] [address] [phone]

## 2016-09-18 LAB — PSA: PROSTATE SPECIFIC AG, SERUM: 8.6 ng/mL — AB (ref 0.0–4.0)

## 2016-09-19 ENCOUNTER — Telehealth: Payer: Self-pay

## 2016-09-19 NOTE — Telephone Encounter (Signed)
-----   Message from Nori Riis, PA-C sent at 09/19/2016  8:20 AM EDT ----- Please add a free and total PSA to his blood work.

## 2016-09-19 NOTE — Telephone Encounter (Signed)
Labs were added.

## 2016-09-22 ENCOUNTER — Telehealth: Payer: Self-pay

## 2016-09-22 DIAGNOSIS — R972 Elevated prostate specific antigen [PSA]: Secondary | ICD-10-CM

## 2016-09-22 NOTE — Telephone Encounter (Signed)
Spoke with pt in reference to PSA and possible prostate cancer. Reinforced with pt if urinary symptoms worsen, hematuria, or bone pain to call back. Pt voiced understanding of whole conversation. Lab apt made and orders placed.

## 2016-09-22 NOTE — Telephone Encounter (Signed)
-----   Message from Nori Riis, PA-C sent at 09/21/2016  2:55 PM EDT ----- Patient's probability of prostate cancer at this time is 35%.  His PSA is still continuing to creep up which is concerning for a possible cancerous situation.  Current recommendations for gentleman at his age is not to undergo prostate biopsy until the PSA reaches levels over 10.  It should be noted at this time that treatment for any prostate cancer at this age is aimed toward palliative care and not a curative treatment.  It would be reasonable at this time to continue a 6 month follow-up unless he develops symptoms prior to that time such as worsening of his urinary symptoms, blood in his urine or bone pain.

## 2016-09-23 LAB — PSA, TOTAL AND FREE
PSA, Free Pct: 11.3 %
PSA, Free: 0.93 ng/mL
Prostate Specific Ag, Serum: 8.2 ng/mL — ABNORMAL HIGH (ref 0.0–4.0)

## 2016-09-23 LAB — SPECIMEN STATUS REPORT

## 2016-12-15 DIAGNOSIS — J449 Chronic obstructive pulmonary disease, unspecified: Secondary | ICD-10-CM | POA: Diagnosis not present

## 2016-12-15 DIAGNOSIS — R109 Unspecified abdominal pain: Secondary | ICD-10-CM | POA: Diagnosis not present

## 2017-03-11 ENCOUNTER — Other Ambulatory Visit: Payer: Medicare Other

## 2017-03-11 DIAGNOSIS — R972 Elevated prostate specific antigen [PSA]: Secondary | ICD-10-CM

## 2017-03-12 LAB — PSA: Prostate Specific Ag, Serum: 6 ng/mL — ABNORMAL HIGH (ref 0.0–4.0)

## 2017-03-18 ENCOUNTER — Ambulatory Visit: Payer: Medicare Other | Admitting: Urology

## 2017-03-24 ENCOUNTER — Other Ambulatory Visit: Payer: Medicare Other

## 2017-03-25 ENCOUNTER — Other Ambulatory Visit: Payer: Self-pay

## 2017-03-25 ENCOUNTER — Other Ambulatory Visit: Payer: Medicare Other

## 2017-03-30 NOTE — Progress Notes (Signed)
9:51 AM   Anthony Meyers 1946/09/30 [id number]  Referring provider: Maryland Pink, MD [address] [facility] [place], [place] [address]  Chief Complaint  Patient presents with  . Benign Prostatic Hypertrophy    6 month follow up  . Elevated PSA    HPI: Patient is a 71 year old African-American male with a history of elevated PSA an BPH presenting today for his biannual follow-up appointment.    History of elevated PSA Patient underwent a prostate biopsy in 2014 for a PSA of 4.5.  Prostate biopsy was negative.  His brother has a history of prostate cancer.  His most recent PSA was 6.0 ng/mL on 03/11/2017.      BPH WITH LUTS His IPSS score today is 4, which is mild lower urinary tract symptomatology. He is pleased with his quality life due to his urinary symptoms.   His previous IPSS score was 1/1.  He has no urinary complaints at this time.  He denies any dysuria, hematuria or suprapubic pain.   He also denies any recent fevers, chills, nausea or vomiting.      IPSS    Row Name 03/31/17 0900         International Prostate Symptom Score   How often have you had the sensation of not emptying your bladder? Not at All     How often have you had to urinate less than every two hours? Not at All     How often have you found you stopped and started again several times when you urinated? Not at All     How often have you found it difficult to postpone urination? Less than 1 in 5 times     How often have you had a weak urinary stream? Less than 1 in 5 times     How often have you had to strain to start urination? Not at All     How many times did you typically get up at night to urinate? 2 Times     Total IPSS Score 4       Quality of Life due to urinary symptoms   If you were to spend the rest of your life with your urinary condition just the way it is now how would you feel about that? Pleased        Score:  1-7 Mild 8-19 Moderate 20-35 Severe   PMH: Past  Medical History:  Diagnosis Date  . Asthma   . BPH (benign prostatic hypertrophy)   . Brain aneurysm   . Chronic prostatitis   . Elevated PSA   . GERD (gastroesophageal reflux disease)   . HTN (hypertension)     Surgical History: Past Surgical History:  Procedure Laterality Date  . BRAIN SURGERY    . COLONOSCOPY WITH PROPOFOL N/A 12/04/2015   Procedure: COLONOSCOPY WITH PROPOFOL;  Surgeon: Lollie Sails, MD;  Location[facility] END[facility]COPY;  Service: Endoscopy;  Laterality: N/A;  . LUNG SURGERY Left     Home Medications:  Allergies as of 03/31/2017   No Known Allergies     Medication List       Accurate as of 03/31/17  9:51 AM. Always use your most recent med list.          aspirin EC 81 MG tablet Take 81 mg by mouth daily.   budesonide-formoterol 160-4.5 MCG/ACT inhaler Commonly known as:  SYMBICORT Inhale 2 puffs into the lungs 2 (two) times daily.   esomeprazole 40 MG capsule Commonly  known as:  NEXIUM Take 40 mg by mouth daily at 12 noon.   hydrochlorothiazide 25 MG tablet Commonly known as:  HYDRODIURIL Take 25 mg by mouth daily.   INCRUSE ELLIPTA 62.5 MCG/INH Aepb Generic drug:  umeclidinium bromide INHALE 1 PUFFS BY MOUTH ONCE DAILY   potassium chloride SA 20 MEQ tablet Commonly known as:  K-DUR,KLOR-CON   tiotropium 18 MCG inhalation capsule Commonly known as:  SPIRIVA Place 18 mcg into inhaler and inhale daily.       Allergies: No Known Allergies  Family History: Family History  Problem Relation Age of Onset  . Throat cancer Brother   . Stroke Father   . Diabetes Mellitus II Father   . Hypertension Father   . Prostate cancer Brother   . Kidney disease Neg Hx   . Kidney cancer Neg Hx   . Bladder Cancer Neg Hx     Social History:  reports that he quit smoking about 21 years ago. He has never used smokeless tobacco. He reports that he does not drink alcohol or use drugs.  ROS: UROLOGY Frequent Urination?: No Hard to postpone  urination?: No Burning/pain with urination?: No Get up at night to urinate?: No Leakage of urine?: No Urine stream starts and stops?: No Trouble starting stream?: No Do you have to strain to urinate?: No Blood in urine?: No Urinary tract infection?: No Sexually transmitted disease?: No Injury to kidneys or bladder?: No Painful intercourse?: No Weak stream?: No Erection problems?: No Penile pain?: No  Gastrointestinal Nausea?: No Vomiting?: No Indigestion/heartburn?: No Diarrhea?: No Constipation?: No  Constitutional Fever: No Night sweats?: No Weight loss?: No Fatigue?: No  Skin Skin rash/lesions?: No Itching?: No  Eyes Blurred vision?: No Double vision?: No  Ears/Nose/Throat Sore throat?: No Sinus problems?: No  Hematologic/Lymphatic Swollen glands?: No Easy bruising?: No  Cardiovascular Leg swelling?: No Chest pain?: No  Respiratory Cough?: Yes Shortness of breath?: No  Endocrine Excessive thirst?: No  Musculoskeletal Back pain?: No Joint pain?: No  Neurological Headaches?: No Dizziness?: No  Psychologic Depression?: No Anxiety?: No  Physical Exam: BP (!) 142/68   Pulse (!) 53   Ht 5\' 9"  (1.753 m)   Wt 192 lb 14.4 oz (87.5 kg)   BMI 28.49 kg/m   GU: No CVA tenderness.  No bladder fullness or masses.  Patient with uncircumcised phallus. Foreskin easily retracted  Urethral meatus is patent.  No penile discharge. No penile lesions or rashes. Scrotum without lesions, cysts, rashes and/or edema.  Testicles are located scrotally bilaterally. No masses are appreciated in the testicles. Left and right epididymis are normal.  Right hydrocele was noted.   Rectal: Patient with  normal sphincter tone. Anus and perineum without scarring or rashes. No rectal masses are appreciated. Prostate is approximately 50 grams, no nodules are appreciated. Seminal vesicles are normal.   Laboratory Data: PSA History  4.15 ng/mL on 07/20/2013  Negative prostate  biopsy  7.4 ng/mL on 09/05/2014  5.3 ng/mL on 09/13/2015  4K was 2 % on 09/13/2015  6.1 ng/mL on 03/13/2016    PSA, Free  0.73 - 35% probability of prostate cancer  8.2 ng/mL on 09/17/2016  6.0 ng/mL on 03/11/2017  Assessment & Plan:    1. Elevated PSA  - see trend above  - current PSA 6.0  - RTC in 6 months for exam and PSA  2. BPH with LUTS  - IPSS score is 4/1, it is worsening  - Continue conservative management, avoiding bladder irritants  and timed voiding's  - RTC in 6 months for IPSS, PSA and exam   Return in about 6 months (around 09/30/2017) for IPSS, PSA and exam.  Zara Council, [facility]  [place] [address], [place], [place] [address] [phone]

## 2017-03-31 ENCOUNTER — Ambulatory Visit (INDEPENDENT_AMBULATORY_CARE_PROVIDER_SITE_OTHER): Payer: Medicare Other | Admitting: Urology

## 2017-03-31 ENCOUNTER — Encounter: Payer: Self-pay | Admitting: Urology

## 2017-03-31 VITALS — BP 142/68 | HR 53 | Ht 69.0 in | Wt 192.9 lb

## 2017-03-31 DIAGNOSIS — N138 Other obstructive and reflux uropathy: Secondary | ICD-10-CM

## 2017-03-31 DIAGNOSIS — N401 Enlarged prostate with lower urinary tract symptoms: Secondary | ICD-10-CM

## 2017-03-31 DIAGNOSIS — R972 Elevated prostate specific antigen [PSA]: Secondary | ICD-10-CM

## 2017-05-23 ENCOUNTER — Emergency Department: Payer: Medicare Other

## 2017-05-23 ENCOUNTER — Inpatient Hospital Stay
Admission: EM | Admit: 2017-05-23 | Discharge: 2017-05-29 | DRG: 418 | Disposition: A | Discharge status: Home / Self Care | Payer: Medicare Other | Attending: Internal Medicine | Admitting: Internal Medicine

## 2017-05-23 ENCOUNTER — Encounter: Payer: Self-pay | Admitting: Emergency Medicine

## 2017-05-23 DIAGNOSIS — K810 Acute cholecystitis: Secondary | ICD-10-CM | POA: Diagnosis not present

## 2017-05-23 DIAGNOSIS — I10 Essential (primary) hypertension: Secondary | ICD-10-CM | POA: Diagnosis present

## 2017-05-23 DIAGNOSIS — Z87891 Personal history of nicotine dependence: Secondary | ICD-10-CM

## 2017-05-23 DIAGNOSIS — N400 Enlarged prostate without lower urinary tract symptoms: Secondary | ICD-10-CM | POA: Diagnosis present

## 2017-05-23 DIAGNOSIS — E876 Hypokalemia: Secondary | ICD-10-CM | POA: Diagnosis present

## 2017-05-23 DIAGNOSIS — R1010 Upper abdominal pain, unspecified: Secondary | ICD-10-CM | POA: Diagnosis not present

## 2017-05-23 DIAGNOSIS — K859 Acute pancreatitis without necrosis or infection, unspecified: Secondary | ICD-10-CM | POA: Diagnosis present

## 2017-05-23 DIAGNOSIS — J45909 Unspecified asthma, uncomplicated: Secondary | ICD-10-CM | POA: Diagnosis present

## 2017-05-23 DIAGNOSIS — K851 Biliary acute pancreatitis without necrosis or infection: Secondary | ICD-10-CM

## 2017-05-23 DIAGNOSIS — Z8042 Family history of malignant neoplasm of prostate: Secondary | ICD-10-CM | POA: Diagnosis not present

## 2017-05-23 DIAGNOSIS — Z808 Family history of malignant neoplasm of other organs or systems: Secondary | ICD-10-CM

## 2017-05-23 DIAGNOSIS — K8020 Calculus of gallbladder without cholecystitis without obstruction: Secondary | ICD-10-CM | POA: Diagnosis not present

## 2017-05-23 DIAGNOSIS — K219 Gastro-esophageal reflux disease without esophagitis: Secondary | ICD-10-CM | POA: Diagnosis present

## 2017-05-23 DIAGNOSIS — Z7951 Long term (current) use of inhaled steroids: Secondary | ICD-10-CM

## 2017-05-23 DIAGNOSIS — I739 Peripheral vascular disease, unspecified: Secondary | ICD-10-CM | POA: Diagnosis present

## 2017-05-23 DIAGNOSIS — K869 Disease of pancreas, unspecified: Secondary | ICD-10-CM | POA: Diagnosis not present

## 2017-05-23 DIAGNOSIS — N411 Chronic prostatitis: Secondary | ICD-10-CM | POA: Diagnosis present

## 2017-05-23 DIAGNOSIS — J449 Chronic obstructive pulmonary disease, unspecified: Secondary | ICD-10-CM | POA: Diagnosis present

## 2017-05-23 DIAGNOSIS — Z419 Encounter for procedure for purposes other than remedying health state, unspecified: Secondary | ICD-10-CM

## 2017-05-23 DIAGNOSIS — R1033 Periumbilical pain: Secondary | ICD-10-CM | POA: Diagnosis not present

## 2017-05-23 DIAGNOSIS — Z8249 Family history of ischemic heart disease and other diseases of the circulatory system: Secondary | ICD-10-CM

## 2017-05-23 DIAGNOSIS — K8066 Calculus of gallbladder and bile duct with acute and chronic cholecystitis without obstruction: Secondary | ICD-10-CM | POA: Diagnosis present

## 2017-05-23 DIAGNOSIS — Z7982 Long term (current) use of aspirin: Secondary | ICD-10-CM

## 2017-05-23 DIAGNOSIS — R109 Unspecified abdominal pain: Secondary | ICD-10-CM | POA: Diagnosis not present

## 2017-05-23 DIAGNOSIS — K8012 Calculus of gallbladder with acute and chronic cholecystitis without obstruction: Secondary | ICD-10-CM | POA: Diagnosis not present

## 2017-05-23 LAB — CBC
HEMATOCRIT: 42.3 % (ref 40.0–52.0)
Hemoglobin: 14 g/dL (ref 13.0–18.0)
MCH: 28.6 pg (ref 26.0–34.0)
MCHC: 33.1 g/dL (ref 32.0–36.0)
MCV: 86.4 fL (ref 80.0–100.0)
Platelets: 202 10*3/uL (ref 150–440)
RBC: 4.89 MIL/uL (ref 4.40–5.90)
RDW: 13.4 % (ref 11.5–14.5)
WBC: 12.1 10*3/uL — ABNORMAL HIGH (ref 3.8–10.6)

## 2017-05-23 LAB — URINALYSIS, COMPLETE (UACMP) WITH MICROSCOPIC
BACTERIA UA: NONE SEEN
Bilirubin Urine: NEGATIVE
GLUCOSE, UA: NEGATIVE mg/dL
HGB URINE DIPSTICK: NEGATIVE
KETONES UR: NEGATIVE mg/dL
Leukocytes, UA: NEGATIVE
Nitrite: NEGATIVE
Protein, ur: NEGATIVE mg/dL
Specific Gravity, Urine: 1.017 (ref 1.005–1.030)
Squamous Epithelial / LPF: NONE SEEN
pH: 5 (ref 5.0–8.0)

## 2017-05-23 LAB — COMPREHENSIVE METABOLIC PANEL
ALBUMIN: 4.3 g/dL (ref 3.5–5.0)
ALK PHOS: 53 U/L (ref 38–126)
ALT: 26 U/L (ref 17–63)
ANION GAP: 8 (ref 5–15)
AST: 29 U/L (ref 15–41)
BUN: 16 mg/dL (ref 6–20)
CALCIUM: 9.6 mg/dL (ref 8.9–10.3)
CHLORIDE: 106 mmol/L (ref 101–111)
CO2: 26 mmol/L (ref 22–32)
Creatinine, Ser: 1.11 mg/dL (ref 0.61–1.24)
GFR calc non Af Amer: >60 mL/min (ref >60)
GLUCOSE: 126 mg/dL — AB (ref 65–99)
POTASSIUM: 3.1 mmol/L — AB (ref 3.5–5.1)
SODIUM: 140 mmol/L (ref 135–145)
Total Bilirubin: 1.3 mg/dL — ABNORMAL HIGH (ref 0.3–1.2)
Total Protein: 7.8 g/dL (ref 6.5–8.1)

## 2017-05-23 LAB — LIPASE, BLOOD: LIPASE: 1403 U/L — AB (ref 11–51)

## 2017-05-23 MED ORDER — ONDANSETRON HCL 4 MG/2ML IJ SOLN
4.0000 mg | Freq: Once | INTRAMUSCULAR | Status: AC
  Administered 2017-05-23: 4 mg via INTRAVENOUS
  Filled 2017-05-23: qty 2

## 2017-05-23 MED ORDER — IOPAMIDOL (ISOVUE-300) INJECTION 61%
100.0000 mL | Freq: Once | INTRAVENOUS | Status: AC | PRN
  Administered 2017-05-23: 100 mL via INTRAVENOUS

## 2017-05-23 MED ORDER — MORPHINE SULFATE (PF) 4 MG/ML IV SOLN
4.0000 mg | Freq: Once | INTRAVENOUS | Status: AC
  Administered 2017-05-23: 4 mg via INTRAVENOUS
  Filled 2017-05-23: qty 1

## 2017-05-23 NOTE — ED Notes (Signed)
Pt. States mid-lower abdominal pain that started around noon today.  Pt. Denies hx of pancreatitis.  Pt. States vomiting twice today.  Pt. States decrease in appetite today.

## 2017-05-23 NOTE — H&P (Signed)
[place] [facility] [place] NAME: Anthony Meyers    MR#:  [id number]  DATE OF BIRTH:  Dec 31, 1945  DATE OF ADMISSION:  05/23/2017  PRIMARY CARE PHYSICIAN: Maryland Pink, MD   REQUESTING/REFERRING PHYSICIAN: Corky Downs, MD  CHIEF COMPLAINT:   Chief Complaint  Patient presents with  . Abdominal Pain    HISTORY OF PRESENT ILLNESS:  Anthony Meyers  is a 71 y.o. male who presents with Acute abdominal pain. Patient states that he started having periumbilical abdominal pain in the morning, by afternoon decided to come to the ED for evaluation. He was found here to have acute pancreatitis. Imaging also reveals sludge in his gallbladder. Hospitalists were called for admission  PAST MEDICAL HISTORY:   Past Medical History:  Diagnosis Date  . Asthma   . BPH (benign prostatic hypertrophy)   . Brain aneurysm   . Chronic prostatitis   . Elevated PSA   . GERD (gastroesophageal reflux disease)   . HTN (hypertension)     PAST SURGICAL HISTORY:   Past Surgical History:  Procedure Laterality Date  . BRAIN SURGERY    . COLONOSCOPY WITH PROPOFOL N/A 12/04/2015   Procedure: COLONOSCOPY WITH PROPOFOL;  Surgeon: Lollie Sails, MD;  Location: [facility] ENDOSCOPY;  Service: Endoscopy;  Laterality: N/A;  . LUNG SURGERY Left     SOCIAL HISTORY:   Social History  Substance Use Topics  . Smoking status: Former Smoker    Quit date: 09/13/1995  . Smokeless tobacco: Never Used  . Alcohol use No    FAMILY HISTORY:   Family History  Problem Relation Age of Onset  . Throat cancer Brother   . Stroke Father   . Diabetes Mellitus II Father   . Hypertension Father   . Prostate cancer Brother   . Kidney disease Neg Hx   . Kidney cancer Neg Hx   . Bladder Cancer Neg Hx     DRUG ALLERGIES:  No Known Allergies  MEDICATIONS AT HOME:   Prior to Admission medications   Medication Sig Start Date End Date Taking? Authorizing Provider  aspirin EC 81 MG  tablet Take 81 mg by mouth daily.   Yes [provider]  budesonide-formoterol (SYMBICORT) 160-4.5 MCG/ACT inhaler Inhale 2 puffs into the lungs 2 (two) times daily.   Yes [provider]  esomeprazole (NEXIUM) 40 MG capsule Take 40 mg by mouth daily at 12 noon.   Yes [provider]  hydrochlorothiazide (HYDRODIURIL) 25 MG tablet Take 25 mg by mouth daily.   Yes [provider]  potassium chloride SA (K-DUR,KLOR-CON) 20 MEQ tablet Take 20 mEq by mouth 2 (two) times daily.    Yes [provider]  tiotropium (SPIRIVA) 18 MCG inhalation capsule Place 18 mcg into inhaler and inhale daily.   Yes [provider]  Umeclidinium Bromide (INCRUSE ELLIPTA) 62.5 MCG/INH AEPB INHALE 1 PUFFS BY MOUTH ONCE DAILY 08/08/15  Yes [provider]    REVIEW OF SYSTEMS:  Review of Systems  Constitutional: Negative for chills, fever, malaise/fatigue and weight loss.  HENT: Negative for ear pain, hearing loss and tinnitus.   Eyes: Negative for blurred vision, double vision, pain and redness.  Respiratory: Negative for cough, hemoptysis and shortness of breath.   Cardiovascular: Negative for chest pain, palpitations, orthopnea and leg swelling.  Gastrointestinal: Positive for abdominal pain. Negative for constipation, diarrhea, nausea and vomiting.  Genitourinary: Negative for dysuria, frequency and hematuria.  Musculoskeletal: Negative for back pain, joint  pain and neck pain.  Skin:       No acne, rash, or lesions  Neurological: Negative for dizziness, tremors, focal weakness and weakness.  Endo/Heme/Allergies: Negative for polydipsia. Does not bruise/bleed easily.  Psychiatric/Behavioral: Negative for depression. The patient is not nervous/anxious and does not have insomnia.      VITAL SIGNS:   Vitals:   05/23/17 2117 05/23/17 2130 05/23/17 2200 05/23/17 2230  BP: 132/67 138/64 (!) 148/65 130/71  Pulse: 75 77 74 76  Resp: 18     Temp:       TempSrc:      SpO2: 95% 95% 95% 93%  Weight:      Height:       Wt Readings from Last 3 Encounters:  05/23/17 83.5 kg (184 lb)  03/31/17 87.5 kg (192 lb 14.4 oz)  09/17/16 84.2 kg (185 lb 9.6 oz)    PHYSICAL EXAMINATION:  Physical Exam  Vitals reviewed. Constitutional: He is oriented to person, place, and time. He appears well-developed and well-nourished. No distress.  HENT:  Head: Normocephalic and atraumatic.  Mouth/Throat: Oropharynx is clear and moist.  Eyes: Conjunctivae and EOM are normal. Pupils are equal, round, and reactive to light. No scleral icterus.  Neck: Normal range of motion. Neck supple. No JVD present. No thyromegaly present.  Cardiovascular: Normal rate, regular rhythm and intact distal pulses.  Exam reveals no gallop and no friction rub.   No murmur heard. Respiratory: Effort normal and breath sounds normal. No respiratory distress. He has no wheezes. He has no rales.  GI: Soft. Bowel sounds are normal. He exhibits no distension. There is tenderness.  Musculoskeletal: Normal range of motion. He exhibits no edema.  No arthritis, no gout  Lymphadenopathy:    He has no cervical adenopathy.  Neurological: He is alert and oriented to person, place, and time. No cranial nerve deficit.  No dysarthria, no aphasia  Skin: Skin is warm and dry. No rash noted. No erythema.  Psychiatric: He has a normal mood and affect. His behavior is normal. Judgment and thought content normal.    LABORATORY PANEL:   CBC  Recent Labs Lab 05/23/17 1815  WBC 12.1*  HGB 14.0  HCT 42.3  PLT 202   ------------------------------------------------------------------------------------------------------------------  Chemistries   Recent Labs Lab 05/23/17 1815  NA 140  K 3.1*  CL 106  CO2 26  GLUCOSE 126*  BUN 16  CREATININE 1.11  CALCIUM 9.6  AST 29  ALT 26  ALKPHOS 53  BILITOT 1.3*    ------------------------------------------------------------------------------------------------------------------  Cardiac Enzymes No results for input(s): TROPONINI in the last 168 hours. ------------------------------------------------------------------------------------------------------------------  RADIOLOGY:  Ct Abdomen Pelvis W Contrast  Result Date: 05/23/2017 CLINICAL DATA:  Mid abdominal pain with emesis and elevated lipase EXAM: CT ABDOMEN AND PELVIS WITH CONTRAST TECHNIQUE: Multidetector CT imaging of the abdomen and pelvis was performed using the standard protocol following bolus administration of intravenous contrast. CONTRAST:  179mL ISOVUE-300 IOPAMIDOL (ISOVUE-300) INJECTION 61% COMPARISON:  None. FINDINGS: Lower chest: Lung bases demonstrate no acute consolidation or pleural effusion. Borderline cardiomegaly. Hepatobiliary: No focal hepatic abnormality. Increased density within the gallbladder fundus suspect for small stones or sludge. There appears to be gallbladder wall thickening or edema. No biliary dilatation.Calcified liver granuloma. Pancreas: Moderate edema and inflammation around the head neck and body of pancreas concerning for acute pancreatitis. No focal fluid collections. Relatively homogeneously enhancement of the pancreas. Spleen: Normal in size without focal abnormality. Adrenals/Urinary Tract: Adrenal glands are unremarkable. Kidneys are normal, without renal  calculi, focal lesion, or hydronephrosis. Bladder is unremarkable. Stomach/Bowel: The stomach is nonenlarged. Mild mucosal enhancement of the duodenum. No evidence for a bowel obstruction. No colon wall thickening. Normal appendix. Sigmoid colon diverticula without acute inflammation. Vascular/Lymphatic: Normal enhancement of the portal vessels and splenic vein. Aortic atherosclerosis. No aneurysmal dilatation. No significantly enlarged lymph nodes. Reproductive: Enlarged prostate with mass effect on the bladder  Other: No free air. Fluid and soft tissue stranding within the anterior pararenal space. Musculoskeletal: No acute or suspicious bone lesion. IMPRESSION: 1. Moderate inflammatory change around the pancreas, concerning for an acute pancreatitis. No focal fluid collections are seen. 2. High density sludge or stones in the gallbladder. There appears to be gallbladder wall edema or surrounding fluid ; recommend correlation with right upper quadrant ultrasound. No biliary dilatation. 3. Mild mucosal enhancement and edematous strange around the duodenum could relate to associated duodenitis. Electronically Signed   By: Donavan Foil M.D.   On: 05/23/2017 22:18    EKG:   Orders placed or performed in visit on 05/19/07  . EKG 12-Lead    IMPRESSION AND PLAN:  Principal Problem:   Acute pancreatitis - patient's lipase was significantly elevated, suspect his pancreatitis is due to biliary pathology. Common bile duct was not dilated, but he did have sludge and possibly small stones in his gallbladder. We will keep him nothing by mouth, use when necessary analgesia for his pain, IV fluids for hydration, and get a surgery consult Active Problems:   Chronic obstructive pulmonary disease ([place]) - home dose inhalers   HTN (hypertension) - continue home meds   GERD (gastroesophageal reflux disease) - home dose PPI  All the records are reviewed and case discussed with ED provider. Management plans discussed with the patient and/or family.  DVT PROPHYLAXIS: SubQ lovenox  GI PROPHYLAXIS: PPI  ADMISSION STATUS: Inpatient  CODE STATUS: Full Code Status History    This patient does not have a recorded code status. Please follow your organizational policy for patients in this situation.      TOTAL TIME TAKING CARE OF THIS PATIENT: 45 minutes.   Nilan Iddings [place] 05/23/2017, 11:56 PM  [facility]  Office  [phone]  CC: Primary care physician; Maryland Pink, MD  Note:  This  document was prepared using Dragon voice recognition software and may include unintentional dictation errors.

## 2017-05-23 NOTE — ED Notes (Signed)
Family member left phone # of [phone] [employer]

## 2017-05-23 NOTE — ED Provider Notes (Signed)
[facility] Emergency Department Provider Note   ____________________________________________    I have reviewed the triage vital signs and the nursing notes.   HISTORY  Chief Complaint Abdominal Pain     HPI Anthony Meyers is a 71 y.o. male who presents with complaints of abdominal pain. Patient reports several days of periumbilical sharp abdominal pain. He has never had this before. He has not taken anything for it. Eating seems to make it worse so he has not been eating. Denies fevers or chills. Mild nausea, no vomiting, no diarrhea. Denies alcohol use, he does not smoke.   Past Medical History:  Diagnosis Date  . Asthma   . BPH (benign prostatic hypertrophy)   . Brain aneurysm   . Chronic prostatitis   . Elevated PSA   . GERD (gastroesophageal reflux disease)   . HTN (hypertension)     Patient Active Problem List   Diagnosis Date Noted  . BPH with obstruction/lower urinary tract symptoms 03/13/2016  . Elevated PSA 03/13/2016  . Chronic obstructive pulmonary disease ([place]) 09/13/2015  . Acid reflux 09/13/2015    Past Surgical History:  Procedure Laterality Date  . BRAIN SURGERY    . COLONOSCOPY WITH PROPOFOL N/A 12/04/2015   Procedure: COLONOSCOPY WITH PROPOFOL;  Surgeon: Lollie Sails, MD;  Location: [facility] ENDOSCOPY;  Service: Endoscopy;  Laterality: N/A;  . LUNG SURGERY Left     Prior to Admission medications   Medication Sig Start Date End Date Taking? Authorizing Provider  aspirin EC 81 MG tablet Take 81 mg by mouth daily.   Yes [provider]  budesonide-formoterol (SYMBICORT) 160-4.5 MCG/ACT inhaler Inhale 2 puffs into the lungs 2 (two) times daily.   Yes [provider]  esomeprazole (NEXIUM) 40 MG capsule Take 40 mg by mouth daily at 12 noon.   Yes [provider]  hydrochlorothiazide (HYDRODIURIL) 25 MG tablet Take 25 mg by mouth daily.   Yes [provider]  potassium chloride SA  (K-DUR,KLOR-CON) 20 MEQ tablet Take 20 mEq by mouth 2 (two) times daily.    Yes [provider]  tiotropium (SPIRIVA) 18 MCG inhalation capsule Place 18 mcg into inhaler and inhale daily.   Yes [provider]  Umeclidinium Bromide (INCRUSE ELLIPTA) 62.5 MCG/INH AEPB INHALE 1 PUFFS BY MOUTH ONCE DAILY 08/08/15  Yes [provider]     Allergies Patient has no known allergies.  Family History  Problem Relation Age of Onset  . Throat cancer Brother   . Stroke Father   . Diabetes Mellitus II Father   . Hypertension Father   . Prostate cancer Brother   . Kidney disease Neg Hx   . Kidney cancer Neg Hx   . Bladder Cancer Neg Hx     Social History Social History  Substance Use Topics  . Smoking status: Former Smoker    Quit date: 09/13/1995  . Smokeless tobacco: Never Used  . Alcohol use No    Review of Systems  Constitutional: No fever/chills Eyes: No visual changes.  ENT: No sore throat. Cardiovascular: Denies chest pain. Respiratory: Denies shortness of breath. Gastrointestinal: As above Genitourinary: Negative for dysuria. Musculoskeletal: Negative for back pain. Skin: Negative for rash. Neurological: Negative for headaches or weakness   ____________________________________________   PHYSICAL EXAM:  VITAL SIGNS: ED Triage Vitals  Enc Vitals Group     BP 05/23/17 1817 124/69     Pulse Rate 05/23/17 1817 85     Resp 05/23/17 1817 16  Temp 05/23/17 1817 98.6 F (37 C)     Temp Source 05/23/17 1817 Oral     SpO2 05/23/17 1817 100 %     Weight 05/23/17 1817 83.5 kg (184 lb)     Height 05/23/17 1817 1.753 m (5\' 9" )     Head Circumference --      Peak Flow --      Pain Score 05/23/17 1816 8     Pain Loc --      Pain Edu? --      Excl. in [place]? --     Constitutional: Alert and oriented. No acute distress. Pleasant and interactive Eyes: Conjunctivae are normal.  . Nose: No congestion/rhinnorhea. Mouth/Throat: Mucous membranes are  moist.    Cardiovascular: Normal rate, regular rhythm. Grossly normal heart sounds.  Good peripheral circulation. Respiratory: Normal respiratory effort.  No retractions. Lungs CTAB. Gastrointestinal: Mild tenderness to palpation periumbilically No distention.  No CVA tenderness. Genitourinary: deferred Musculoskeletal:   Warm and well perfused Neurologic:  Normal speech and language. No gross focal neurologic deficits are appreciated.  Skin:  Skin is warm, dry and intact. No rash noted. Psychiatric: Mood and affect are normal. Speech and behavior are normal.  ____________________________________________   LABS (all labs ordered are listed, but only abnormal results are displayed)  Labs Reviewed  LIPASE, BLOOD - Abnormal; Notable for the following:       Result Value   Lipase 1,403 (*)    All other components within normal limits  COMPREHENSIVE METABOLIC PANEL - Abnormal; Notable for the following:    Potassium 3.1 (*)    Glucose, Bld 126 (*)    Total Bilirubin 1.3 (*)    All other components within normal limits  CBC - Abnormal; Notable for the following:    WBC 12.1 (*)    All other components within normal limits  URINALYSIS, COMPLETE (UACMP) WITH MICROSCOPIC - Abnormal; Notable for the following:    Color, Urine YELLOW (*)    APPearance CLEAR (*)    All other components within normal limits   ____________________________________________  EKG  None ____________________________________________  RADIOLOGY  CT scan demonstrates inflammatory change around the pancreas, possible gallbladder wall edema Right upper quadrant ultrasound pending, this will be followed up by hospitalist ____________________________________________   PROCEDURES  Procedure(s) performed: No    Critical Care performed: No ____________________________________________   INITIAL IMPRESSION / ASSESSMENT AND PLAN / ED COURSE  Pertinent labs & imaging results that were available during my  care of the patient were reviewed by me and considered in my medical decision making (see chart for details).  Patient presents with periumbilical abdominal pain, he has a significantly elevated lipase consistent with pancreatitis. CT confirms this. Ultrasound pending to evaluate  gallbladder. Patient is admitted to the hospitalist service for further management and care. Received IV analgesics and nausea medication in the emergency department    ____________________________________________   FINAL CLINICAL IMPRESSION(S) / ED DIAGNOSES  Final diagnoses:  Upper abdominal pain  Acute biliary pancreatitis, unspecified complication status      NEW MEDICATIONS STARTED DURING THIS VISIT:  New Prescriptions   No medications on file     Note:  This document was prepared using Dragon voice recognition software and may include unintentional dictation errors.    Lavonia Drafts, MD 05/23/17 [phone]

## 2017-05-23 NOTE — ED Triage Notes (Signed)
Pt to triage via wheelchair, report mid abd pain since this AM, report emesis x 2 and diarrhea x 2.  Pt in NAD at this time, resp equal and unlabored, skin warm and dry.

## 2017-05-24 DIAGNOSIS — K851 Biliary acute pancreatitis without necrosis or infection: Principal | ICD-10-CM

## 2017-05-24 LAB — CBC
HCT: 40.4 % (ref 40.0–52.0)
HEMOGLOBIN: 13.5 g/dL (ref 13.0–18.0)
MCH: 29.3 pg (ref 26.0–34.0)
MCHC: 33.5 g/dL (ref 32.0–36.0)
MCV: 87.4 fL (ref 80.0–100.0)
PLATELETS: 186 10*3/uL (ref 150–440)
RBC: 4.62 MIL/uL (ref 4.40–5.90)
RDW: 13.4 % (ref 11.5–14.5)
WBC: 9.3 10*3/uL (ref 3.8–10.6)

## 2017-05-24 LAB — BASIC METABOLIC PANEL
ANION GAP: 8 (ref 5–15)
BUN: 14 mg/dL (ref 6–20)
CALCIUM: 8.9 mg/dL (ref 8.9–10.3)
CO2: 26 mmol/L (ref 22–32)
CREATININE: 0.95 mg/dL (ref 0.61–1.24)
Chloride: 105 mmol/L (ref 101–111)
GFR calc Af Amer: >60 mL/min (ref >60)
GLUCOSE: 113 mg/dL — AB (ref 65–99)
Potassium: 2.9 mmol/L — ABNORMAL LOW (ref 3.5–5.1)
Sodium: 139 mmol/L (ref 135–145)

## 2017-05-24 LAB — LIPASE, BLOOD: LIPASE: 291 U/L — AB (ref 11–51)

## 2017-05-24 MED ORDER — ONDANSETRON HCL 4 MG/2ML IJ SOLN: 4.0000 mg | Freq: Four times a day (QID) | INTRAMUSCULAR | Status: DC | PRN

## 2017-05-24 MED ORDER — OXYCODONE HCL 5 MG PO TABS
5.0000 mg | ORAL_TABLET | ORAL | Status: DC | PRN
Start: 2017-05-24 — End: 2017-05-26
  Administered 2017-05-24 – 2017-05-26 (×5): 5 mg via ORAL
  Filled 2017-05-24 (×5): qty 1

## 2017-05-24 MED ORDER — SODIUM CHLORIDE 0.9 % IV SOLN
INTRAVENOUS | Status: DC
  Administered 2017-05-24: 02:00:00 via INTRAVENOUS

## 2017-05-24 MED ORDER — ENOXAPARIN SODIUM 40 MG/0.4ML ~~LOC~~ SOLN
40.0000 mg | SUBCUTANEOUS | Status: DC
  Administered 2017-05-24 – 2017-05-28 (×5): 40 mg via SUBCUTANEOUS
  Filled 2017-05-24 (×5): qty 0.4

## 2017-05-24 MED ORDER — ASPIRIN EC 81 MG PO TBEC
81.0000 mg | DELAYED_RELEASE_TABLET | Freq: Every day | ORAL | Status: DC
  Administered 2017-05-24 – 2017-05-29 (×6): 81 mg via ORAL
  Filled 2017-05-24 (×6): qty 1

## 2017-05-24 MED ORDER — ACETAMINOPHEN 650 MG RE SUPP: 650.0000 mg | Freq: Four times a day (QID) | RECTAL | Status: DC | PRN

## 2017-05-24 MED ORDER — POTASSIUM CHLORIDE 10 MEQ/100ML IV SOLN
10.0000 meq | INTRAVENOUS | Status: AC
  Administered 2017-05-24 (×3): 10 meq via INTRAVENOUS
  Filled 2017-05-24 (×3): qty 100

## 2017-05-24 MED ORDER — PANTOPRAZOLE SODIUM 40 MG PO TBEC
40.0000 mg | DELAYED_RELEASE_TABLET | Freq: Every day | ORAL | Status: DC
  Administered 2017-05-24 – 2017-05-29 (×6): 40 mg via ORAL
  Filled 2017-05-24 (×6): qty 1

## 2017-05-24 MED ORDER — POTASSIUM CHLORIDE IN NACL 40-0.9 MEQ/L-% IV SOLN
INTRAVENOUS | Status: DC
  Administered 2017-05-24 – 2017-05-25 (×3): 100 mL/h via INTRAVENOUS
  Filled 2017-05-24 (×5): qty 1000

## 2017-05-24 MED ORDER — TIOTROPIUM BROMIDE MONOHYDRATE 18 MCG IN CAPS: 18.0000 ug | ORAL_CAPSULE | Freq: Every day | RESPIRATORY_TRACT | Status: DC

## 2017-05-24 MED ORDER — UMECLIDINIUM BROMIDE 62.5 MCG/INH IN AEPB
1.0000 | INHALATION_SPRAY | Freq: Every day | RESPIRATORY_TRACT | Status: DC
  Administered 2017-05-24 – 2017-05-29 (×6): 1 via RESPIRATORY_TRACT
  Filled 2017-05-24: qty 7

## 2017-05-24 MED ORDER — ONDANSETRON HCL 4 MG PO TABS: 4.0000 mg | ORAL_TABLET | Freq: Four times a day (QID) | ORAL | Status: DC | PRN

## 2017-05-24 MED ORDER — POTASSIUM CHLORIDE 10 MEQ/100ML IV SOLN
10.0000 meq | Freq: Once | INTRAVENOUS | Status: AC
  Administered 2017-05-24: 10 meq via INTRAVENOUS
  Filled 2017-05-24: qty 100

## 2017-05-24 MED ORDER — MORPHINE SULFATE (PF) 4 MG/ML IV SOLN
4.0000 mg | INTRAVENOUS | Status: DC | PRN
  Administered 2017-05-24 – 2017-05-25 (×2): 4 mg via INTRAVENOUS
  Filled 2017-05-24 (×2): qty 1

## 2017-05-24 MED ORDER — MOMETASONE FURO-FORMOTEROL FUM 200-5 MCG/ACT IN AERO
2.0000 | INHALATION_SPRAY | Freq: Two times a day (BID) | RESPIRATORY_TRACT | Status: DC
  Administered 2017-05-24 – 2017-05-29 (×11): 2 via RESPIRATORY_TRACT
  Filled 2017-05-24: qty 8.8

## 2017-05-24 MED ORDER — ACETAMINOPHEN 325 MG PO TABS
650.0000 mg | ORAL_TABLET | Freq: Four times a day (QID) | ORAL | Status: DC | PRN
  Administered 2017-05-25 – 2017-05-27 (×4): 650 mg via ORAL
  Filled 2017-05-24 (×4): qty 2

## 2017-05-24 NOTE — Progress Notes (Signed)
[place] [facility] [place] NAME: Anthony Meyers    MR#:  [id number]  DATE OF BIRTH:  [birth year]  SUBJECTIVE:  CHIEF COMPLAINT:   Chief Complaint  Patient presents with  . Abdominal Pain   -Still has periumbilical abdominal pain and nausea. Lipase is improving  REVIEW OF SYSTEMS:  Review of Systems  Constitutional: Negative for chills, fever and malaise/fatigue.  HENT: Negative for hearing loss and tinnitus.   Eyes: Negative for blurred vision and double vision.  Respiratory: Negative for cough, shortness of breath and wheezing.   Cardiovascular: Negative for chest pain, palpitations and leg swelling.  Gastrointestinal: Positive for abdominal pain and nausea. Negative for constipation, diarrhea and vomiting.  Genitourinary: Negative for dysuria.  Musculoskeletal: Negative for myalgias.  Neurological: Negative for dizziness, sensory change, speech change, focal weakness, seizures and headaches.  Psychiatric/Behavioral: Negative for depression.    DRUG ALLERGIES:  No Known Allergies  VITALS:  Blood pressure 112/60, pulse 72, temperature 98.7 F (37.1 C), temperature source Oral, resp. rate 20, height 5\' 9"  (1.753 m), weight 83.6 kg (184 lb 3.2 oz), SpO2 91 %.  PHYSICAL EXAMINATION:  Physical Exam  GENERAL:  71 y.o.-year-old patient lying in the bed with no acute distress.  EYES: Pupils equal, round, reactive to light and accommodation. No scleral icterus. Extraocular muscles intact.  HEENT: Head atraumatic, normocephalic. Oropharynx and nasopharynx clear.  NECK:  Supple, no jugular venous distention. No thyroid enlargement, no tenderness.  LUNGS: Normal breath sounds bilaterally, no wheezing, rales,rhonchi or crepitation. No use of accessory muscles of respiration.  CARDIOVASCULAR: S1, S2 normal. No murmurs, rubs, or gallops.  ABDOMEN: Soft, tender in periumbilical, nondistended. Bowel sounds present. No organomegaly or mass.    EXTREMITIES: No pedal edema, cyanosis, or clubbing.  NEUROLOGIC: Cranial nerves II through XII are intact. Muscle strength 5/5 in all extremities. Sensation intact. Gait not checked.  PSYCHIATRIC: The patient is alert and oriented x 3.  SKIN: No obvious rash, lesion, or ulcer.    LABORATORY PANEL:   CBC  Recent Labs Lab 05/24/17 0420  WBC 9.3  HGB 13.5  HCT 40.4  PLT 186   ------------------------------------------------------------------------------------------------------------------  Chemistries   Recent Labs Lab 05/23/17 1815 05/24/17 0420  NA 140 139  K 3.1* 2.9*  CL 106 105  CO2 26 26  GLUCOSE 126* 113*  BUN 16 14  CREATININE 1.11 0.95  CALCIUM 9.6 8.9  AST 29  --   ALT 26  --   ALKPHOS 53  --   BILITOT 1.3*  --    ------------------------------------------------------------------------------------------------------------------  Cardiac Enzymes No results for input(s): TROPONINI in the last 168 hours. ------------------------------------------------------------------------------------------------------------------  RADIOLOGY:  Ct Abdomen Pelvis W Contrast  Result Date: 05/23/2017 CLINICAL DATA:  Mid abdominal pain with emesis and elevated lipase EXAM: CT ABDOMEN AND PELVIS WITH CONTRAST TECHNIQUE: Multidetector CT imaging of the abdomen and pelvis was performed using the standard protocol following bolus administration of intravenous contrast. CONTRAST:  157mL ISOVUE-300 IOPAMIDOL (ISOVUE-300) INJECTION 61% COMPARISON:  None. FINDINGS: Lower chest: Lung bases demonstrate no acute consolidation or pleural effusion. Borderline cardiomegaly. Hepatobiliary: No focal hepatic abnormality. Increased density within the gallbladder fundus suspect for small stones or sludge. There appears to be gallbladder wall thickening or edema. No biliary dilatation.Calcified liver granuloma. Pancreas: Moderate edema and inflammation around the head neck and body of pancreas  concerning for acute pancreatitis. No focal fluid collections. Relatively homogeneously enhancement of the pancreas. Spleen: Normal in size without focal abnormality.  Adrenals/Urinary Tract: Adrenal glands are unremarkable. Kidneys are normal, without renal calculi, focal lesion, or hydronephrosis. Bladder is unremarkable. Stomach/Bowel: The stomach is nonenlarged. Mild mucosal enhancement of the duodenum. No evidence for a bowel obstruction. No colon wall thickening. Normal appendix. Sigmoid colon diverticula without acute inflammation. Vascular/Lymphatic: Normal enhancement of the portal vessels and splenic vein. Aortic atherosclerosis. No aneurysmal dilatation. No significantly enlarged lymph nodes. Reproductive: Enlarged prostate with mass effect on the bladder Other: No free air. Fluid and soft tissue stranding within the anterior pararenal space. Musculoskeletal: No acute or suspicious bone lesion. IMPRESSION: 1. Moderate inflammatory change around the pancreas, concerning for an acute pancreatitis. No focal fluid collections are seen. 2. High density sludge or stones in the gallbladder. There appears to be gallbladder wall edema or surrounding fluid ; recommend correlation with right upper quadrant ultrasound. No biliary dilatation. 3. Mild mucosal enhancement and edematous strange around the duodenum could relate to associated duodenitis. Electronically Signed   By: Donavan Foil M.D.   On: 05/23/2017 22:18   US Abdomen Limited Ruq  Result Date: 05/24/2017 CLINICAL DATA:  Acute onset of mid abdominal pain. Recently diagnosed pancreatitis. Initial encounter. EXAM: ULTRASOUND ABDOMEN LIMITED RIGHT UPPER QUADRANT COMPARISON:  CT of the abdomen and pelvis performed earlier today at 9:45 p.m. FINDINGS: Gallbladder: Sludge is noted mostly filling the gallbladder, with associated small stones. Gallbladder wall thickening and pericholecystic fluid are noted. No ultrasonographic Murphy's sign is elicited, but  this raises concern for cholecystitis. Common bile duct: Diameter: 0.7 cm, within normal limits for the patient's age. Liver: No focal lesion identified. Within normal limits in parenchymal echogenicity. A calcified granuloma is noted at the right hepatic lobe. IMPRESSION: Sludge noted mostly filling the gallbladder, with associated small stones. Underlying gallbladder wall thickening and pericholecystic fluid noted. Though no ultrasonographic Murphy's sign is elicited, findings are concerning for mild acute cholecystitis. Electronically Signed   By: Garald Balding M.D.   On: 05/24/2017 00:00    EKG:   Orders placed or performed in visit on 05/19/07  . EKG 12-Lead    ASSESSMENT AND PLAN:   71 year old male with past medical history significant for hypertension, GERD, BPH admitted for abdominal pain and noted to have acute gallstone pancreatitis.  #1 acute gallstone pancreatitis-CT of the abdomen with a can gallbladder wall with sludge versus stones and elevated lipase. -Symptoms are only slightly better today. Likely lipase is improving. -Continue nothing by mouth status, IV fluids. -Hold hydrochlorothiazide -Appreciate surgical consult. Patient's pancreatitis has to be resolved prior to considering laparoscopic cholecystectomy.  #2 hypokalemia-being replaced aggressively, monitor  #3 asthma-stable. Continue home inhalers.  #4 GERD-currently on Protonix  #5 hypertension-hold hydrochlorothiazide   Updated his niece over the phone     All the records are reviewed and case discussed with Care Management/Social Workerr. Management plans discussed with the patient, family and they are in agreement.  CODE STATUS: Full Code  TOTAL TIME TAKING CARE OF THIS PATIENT: 38 minutes.   POSSIBLE D/C IN 1-2DAYS, DEPENDING ON CLINICAL CONDITION.   Gladstone Lighter M.D on 05/24/2017 at 11:32 AM  Between 7am to 6pm - Pager - [phone]  After 6pm go to [email or web address] - password [email or web address]  [facility]  Office  [phone]  CC: Primary care physician; [place] Pink, MD

## 2017-05-24 NOTE — ED Notes (Signed)
Pt ambulatory to toilet without assistance 

## 2017-05-24 NOTE — Consult Note (Signed)
Patient ID: Anthony Meyers, male   DOB: Jan 19, 1946, 71 y.o.   MRN: [id number]  CC: Abdominal pain  HPI Anthony Meyers is a 71 y.o. male was currently minutes the medicine service with pancreatitis. Surgery consult requested by Dr. Jannifer Franklin for evaluation of presumed gallstone pancreatitis. Patient reports a several day history of periumbilical abdominal pain that goes up to his mid epigastrium. This is the first time this is ever happened and he did nothing to try to treated at home. Eating seems to make the pain worse but what he ate did not matter. The pain is mostly described as a constant pressure to the mid abdomen. He denies any fevers, chills, chest pain, shortness of breath, diarrhea, constipation. He has had some nausea but no vomiting. He is otherwise in his usual state of health.  HPI  Past Medical History:  Diagnosis Date  . Asthma   . BPH (benign prostatic hypertrophy)   . Brain aneurysm   . Chronic prostatitis   . Elevated PSA   . GERD (gastroesophageal reflux disease)   . HTN (hypertension)     Past Surgical History:  Procedure Laterality Date  . BRAIN SURGERY    . COLONOSCOPY WITH PROPOFOL N/A 12/04/2015   Procedure: COLONOSCOPY WITH PROPOFOL;  Surgeon: Lollie Sails, MD;  Location: [facility] ENDOSCOPY;  Service: Endoscopy;  Laterality: N/A;  . LUNG SURGERY Left     Family History  Problem Relation Age of Onset  . Throat cancer Brother   . Stroke Father   . Diabetes Mellitus II Father   . Hypertension Father   . Prostate cancer Brother   . Kidney disease Neg Hx   . Kidney cancer Neg Hx   . Bladder Cancer Neg Hx     Social History Social History  Substance Use Topics  . Smoking status: Former Smoker    Quit date: 09/13/1995  . Smokeless tobacco: Never Used  . Alcohol use No    No Known Allergies  Current Facility-Administered Medications  Medication Dose Route Frequency Provider Last Rate Last Dose  . 0.9 % NaCl with KCl 40 mEq / L  infusion   Intravenous  Continuous Gladstone Lighter, MD 100 mL/hr at 05/24/17 0750 100 mL/hr at 05/24/17 0750  . acetaminophen (TYLENOL) tablet 650 mg  650 mg Oral Q6H PRN Lance Coon, MD       Or  . acetaminophen (TYLENOL) suppository 650 mg  650 mg Rectal Q6H PRN Lance Coon, MD      . aspirin EC tablet 81 mg  81 mg Oral Daily Lance Coon, MD   81 mg at 05/24/17 1006  . enoxaparin (LOVENOX) injection 40 mg  40 mg Subcutaneous Q24H Lance Coon, MD      . mometasone-formoterol [facility]) 200-5 MCG/ACT inhaler 2 puff  2 puff Inhalation BID Lance Coon, MD   2 puff at 05/24/17 0749  . morphine 4 MG/ML injection 4 mg  4 mg Intravenous Q4H PRN Lance Coon, MD   4 mg at 05/24/17 0623  . ondansetron (ZOFRAN) tablet 4 mg  4 mg Oral Q6H PRN Lance Coon, MD       Or  . ondansetron [facility]) injection 4 mg  4 mg Intravenous Q6H PRN Lance Coon, MD      . oxyCODONE (Oxy IR/ROXICODONE) immediate release tablet 5 mg  5 mg Oral Q4H PRN Lance Coon, MD      . pantoprazole (PROTONIX) EC tablet 40 mg  40 mg Oral Daily Lance Coon, MD  40 mg at 05/24/17 1006  . potassium chloride 10 mEq in 100 mL IVPB  10 mEq Intravenous Q1 Hr x 4 Gladstone Lighter, MD 100 mL/hr at 05/24/17 1007 10 mEq at 05/24/17 1007  . umeclidinium bromide (INCRUSE ELLIPTA) 62.5 MCG/INH 1 puff  1 puff Inhalation Daily Lance Coon, MD         Review of Systems A Multi-point review of systems was asked and was negative except for the findings documented in the history of present illness  Physical Exam Blood pressure 112/60, pulse 72, temperature 98.7 F (37.1 C), temperature source Oral, resp. rate 20, height 5\' 9"  (1.753 m), weight 83.6 kg (184 lb 3.2 oz), SpO2 91 %. CONSTITUTIONAL: No acute distress. EYES: Pupils are equal, round, and reactive to light, Sclera are non-icteric. EARS, NOSE, MOUTH AND THROAT: The oropharynx is clear. The oral mucosa is pink and moist. Hearing is intact to voice. LYMPH NODES:  Lymph nodes in the neck are  normal. RESPIRATORY:  Lungs are clear. There is normal respiratory effort, with equal breath sounds bilaterally, and without pathologic use of accessory muscles. CARDIOVASCULAR: Heart is regular without murmurs, gallops, or rubs. GI: The abdomen is soft, tender to palpation in the midabdomen on deep palpation, and nondistended. There are no palpable masses. There is no hepatosplenomegaly. There are normal bowel sounds in all quadrants. GU: Rectal deferred.   MUSCULOSKELETAL: Normal muscle strength and tone. No cyanosis or edema.   SKIN: Turgor is good and there are no pathologic skin lesions or ulcers. NEUROLOGIC: Motor and sensation is grossly normal. Cranial nerves are grossly intact. PSYCH:  Oriented to person, place and time. Affect is normal.  Data Reviewed Images and labs reviewed. Labs showed improved lipase of 291 down from 1400. No other LFTs checked today but bilirubin was 1.3 on admission. Normal white blood cell count of 9.3 down from 12.1 on admission. Ultrasound and CT scan reviewed. CT scan shows inflammation of the pancreas consistent with pancreatitis as well as cholelithiasis. Ultrasound the right upper quadrant does show some mild gallbladder wall thickening and pericholecystic fluid with numerous small stones and sludge within the gallbladder. I have personally reviewed the patient's imaging, laboratory findings and medical records.    Assessment    Gallstone pancreatitis    Plan    71 year old male with gallstone pancreatitis. Appears to be responding to current medical therapy as his lipase has decreased but remains elevated. He continues to have tenderness with states it is improved. Discussed with the patient that the best way to prevent gallstone pancreatitis is to perform a laparoscopic cholecystectomy. Discussed that his inflammation and pain would need to be completely gone prior to any surgical intervention being offered. He does have numerous medical problems and he  will likely require clearance prior to being scheduled for a laparoscopic procedure. Discussed all this with the patient and he voiced understanding. General surgery will follow with you.     Time spent with the patient was 80 minutes, with more than 50% of the time spent in face-to-face education, counseling and care coordination.     Clayburn Pert, MD FACS General Surgeon 05/24/2017, 10:50 AM

## 2017-05-25 LAB — COMPREHENSIVE METABOLIC PANEL
ALBUMIN: 3.2 g/dL — AB (ref 3.5–5.0)
ALK PHOS: 46 U/L (ref 38–126)
ALT: 15 U/L — AB (ref 17–63)
ANION GAP: 6 (ref 5–15)
AST: 17 U/L (ref 15–41)
BILIRUBIN TOTAL: 1.4 mg/dL — AB (ref 0.3–1.2)
BUN: 14 mg/dL (ref 6–20)
CALCIUM: 8.6 mg/dL — AB (ref 8.9–10.3)
CO2: 25 mmol/L (ref 22–32)
CREATININE: 1.12 mg/dL (ref 0.61–1.24)
Chloride: 108 mmol/L (ref 101–111)
GFR calc Af Amer: >60 mL/min (ref >60)
GFR calc non Af Amer: >60 mL/min (ref >60)
GLUCOSE: 101 mg/dL — AB (ref 65–99)
Potassium: 4 mmol/L (ref 3.5–5.1)
SODIUM: 139 mmol/L (ref 135–145)
TOTAL PROTEIN: 6.6 g/dL (ref 6.5–8.1)

## 2017-05-25 LAB — LIPASE, BLOOD: Lipase: 46 U/L (ref 11–51)

## 2017-05-25 NOTE — Care Management Important Message (Signed)
Important Message  Patient Details  Name: Anthony Meyers MRN: [id number] Date of Birth: 10-Jun-1946   Medicare Important Message Given:  Yes    Beverly Sessions, RN 05/25/2017, 1:41 PM

## 2017-05-25 NOTE — Progress Notes (Signed)
[facility] NAME: Anthony Meyers    MR#:  [id number]  DATE OF BIRTH:  12/27/1945  SUBJECTIVE:  CHIEF COMPLAINT:   Chief Complaint  Patient presents with  . Abdominal Pain   -Still has some periumbilical abdominal pain and nausea. Lipase is improving, was ready to try liquid food today.  REVIEW OF SYSTEMS:  Review of Systems  Constitutional: Negative for chills, fever and malaise/fatigue.  HENT: Negative for hearing loss and tinnitus.   Eyes: Negative for blurred vision and double vision.  Respiratory: Negative for cough, shortness of breath and wheezing.   Cardiovascular: Negative for chest pain, palpitations and leg swelling.  Gastrointestinal: Positive for abdominal pain and nausea. Negative for constipation, diarrhea and vomiting.  Genitourinary: Negative for dysuria.  Musculoskeletal: Negative for myalgias.  Neurological: Negative for dizziness, sensory change, speech change, focal weakness, seizures and headaches.  Psychiatric/Behavioral: Negative for depression.    DRUG ALLERGIES:  No Known Allergies  VITALS:  Blood pressure 118/62, pulse 81, temperature 99.4 F (37.4 C), temperature source Oral, resp. rate 18, height 5\' 9"  (1.753 m), weight 83.6 kg (184 lb 3.2 oz), SpO2 95 %.  PHYSICAL EXAMINATION:  Physical Exam  GENERAL:  71 y.o.-year-old patient lying in the bed with no acute distress.  EYES: Pupils equal, round, reactive to light and accommodation. No scleral icterus. Extraocular muscles intact.  HEENT: Head atraumatic, normocephalic. Oropharynx and nasopharynx clear.  NECK:  Supple, no jugular venous distention. No thyroid enlargement, no tenderness.  LUNGS: Normal breath sounds bilaterally, no wheezing, rales,rhonchi or crepitation. No use of accessory muscles of respiration.  CARDIOVASCULAR: S1, S2 normal. No murmurs, rubs, or gallops.  ABDOMEN: Soft, tender in periumbilical, nondistended. Bowel sounds  present. No organomegaly or mass.  EXTREMITIES: No pedal edema, cyanosis, or clubbing.  NEUROLOGIC: Cranial nerves II through XII are intact. Muscle strength 5/5 in all extremities. Sensation intact. Gait not checked.  PSYCHIATRIC: The patient is alert and oriented x 3.  SKIN: No obvious rash, lesion, or ulcer.    LABORATORY PANEL:   CBC  Recent Labs Lab 05/24/17 0420  WBC 9.3  HGB 13.5  HCT 40.4  PLT 186   ------------------------------------------------------------------------------------------------------------------  Chemistries   Recent Labs Lab 05/25/17 0447  NA 139  K 4.0  CL 108  CO2 25  GLUCOSE 101*  BUN 14  CREATININE 1.12  CALCIUM 8.6*  AST 17  ALT 15*  ALKPHOS 46  BILITOT 1.4*   ------------------------------------------------------------------------------------------------------------------  Cardiac Enzymes No results for input(s): TROPONINI in the last 168 hours. ------------------------------------------------------------------------------------------------------------------  RADIOLOGY:  Ct Abdomen Pelvis W Contrast  Result Date: 05/23/2017 CLINICAL DATA:  Mid abdominal pain with emesis and elevated lipase EXAM: CT ABDOMEN AND PELVIS WITH CONTRAST TECHNIQUE: Multidetector CT imaging of the abdomen and pelvis was performed using the standard protocol following bolus administration of intravenous contrast. CONTRAST:  145mL ISOVUE-300 IOPAMIDOL (ISOVUE-300) INJECTION 61% COMPARISON:  None. FINDINGS: Lower chest: Lung bases demonstrate no acute consolidation or pleural effusion. Borderline cardiomegaly. Hepatobiliary: No focal hepatic abnormality. Increased density within the gallbladder fundus suspect for small stones or sludge. There appears to be gallbladder wall thickening or edema. No biliary dilatation.Calcified liver granuloma. Pancreas: Moderate edema and inflammation around the head neck and body of pancreas concerning for acute pancreatitis. No  focal fluid collections. Relatively homogeneously enhancement of the pancreas. Spleen: Normal in size without focal abnormality. Adrenals/Urinary Tract: Adrenal glands are unremarkable. Kidneys are normal, without renal calculi, focal lesion, or  hydronephrosis. Bladder is unremarkable. Stomach/Bowel: The stomach is nonenlarged. Mild mucosal enhancement of the duodenum. No evidence for a bowel obstruction. No colon wall thickening. Normal appendix. Sigmoid colon diverticula without acute inflammation. Vascular/Lymphatic: Normal enhancement of the portal vessels and splenic vein. Aortic atherosclerosis. No aneurysmal dilatation. No significantly enlarged lymph nodes. Reproductive: Enlarged prostate with mass effect on the bladder Other: No free air. Fluid and soft tissue stranding within the anterior pararenal space. Musculoskeletal: No acute or suspicious bone lesion. IMPRESSION: 1. Moderate inflammatory change around the pancreas, concerning for an acute pancreatitis. No focal fluid collections are seen. 2. High density sludge or stones in the gallbladder. There appears to be gallbladder wall edema or surrounding fluid ; recommend correlation with right upper quadrant ultrasound. No biliary dilatation. 3. Mild mucosal enhancement and edematous strange around the duodenum could relate to associated duodenitis. Electronically Signed   By: Donavan Foil M.D.   On: 05/23/2017 22:18   US Abdomen Limited Ruq  Result Date: 05/24/2017 CLINICAL DATA:  Acute onset of mid abdominal pain. Recently diagnosed pancreatitis. Initial encounter. EXAM: ULTRASOUND ABDOMEN LIMITED RIGHT UPPER QUADRANT COMPARISON:  CT of the abdomen and pelvis performed earlier today at 9:45 p.m. FINDINGS: Gallbladder: Sludge is noted mostly filling the gallbladder, with associated small stones. Gallbladder wall thickening and pericholecystic fluid are noted. No ultrasonographic Murphy's sign is elicited, but this raises concern for cholecystitis.  Common bile duct: Diameter: 0.7 cm, within normal limits for the patient's age. Liver: No focal lesion identified. Within normal limits in parenchymal echogenicity. A calcified granuloma is noted at the right hepatic lobe. IMPRESSION: Sludge noted mostly filling the gallbladder, with associated small stones. Underlying gallbladder wall thickening and pericholecystic fluid noted. Though no ultrasonographic Murphy's sign is elicited, findings are concerning for mild acute cholecystitis. Electronically Signed   By: Garald Balding M.D.   On: 05/24/2017 00:00    EKG:   Orders placed or performed in visit on 05/19/07  . EKG 12-Lead    ASSESSMENT AND PLAN:   71 year old male with past medical history significant for hypertension, GERD, BPH admitted for abdominal pain and noted to have acute gallstone pancreatitis.  #1 acute gallstone pancreatitis-CT of the abdomen with thicken gallbladder wall with sludge versus stones and elevated lipase. -Symptoms are only slightly better today. Likely lipase is improving. -Continue nothing by mouth status, IV fluids. Now started full liquid diet today. -Hold hydrochlorothiazide -Appreciate surgical consult. Patient's pancreatitis has to be resolved prior to considering laparoscopic cholecystectomy. Plan is for procedure tomorrow.  #2 hypokalemia-being replaced aggressively, monitor  #3 asthma-stable. Continue home inhalers.  #4 GERD-currently on Protonix  #5 hypertension-hold hydrochlorothiazide    All the records are reviewed and case discussed with Care Management/Social Workerr. Management plans discussed with the patient, family and they are in agreement.  CODE STATUS: Full Code  TOTAL TIME TAKING CARE OF THIS PATIENT: 35 minutes.   POSSIBLE D/C IN 1-2DAYS, DEPENDING ON CLINICAL CONDITION.   Vaughan Basta M.D on 05/25/2017 at 5:06 PM  Between 7am to 6pm - Pager - [phone]  After 6pm go to [email or web address] - password [email or web address]  [facility]  Office  [phone]  CC: Primary care physician; [place] Pink, MD

## 2017-05-25 NOTE — Progress Notes (Signed)
CC: Biliary pancreatitis Subjective: Patient reports intermittent waves of abdominal pain with mild nausea and no emesis and his pain is improved over what it had been. He points to the right upper quadrant. No jaundice or acholic stools and no fevers or chills  Objective: Vital signs in last 24 hours: Temp:  [98.3 F (36.8 C)-100 F (37.8 C)] 100 F (37.8 C) (06/18 0550) Pulse Rate:  [70-90] 87 (06/18 0550) Resp:  [20] 20 (06/18 0550) BP: (115-129)/(58-64) 129/60 (06/18 0550) SpO2:  [93 %-96 %] 94 % (06/18 0550) Last BM Date: 05/23/17  Intake/Output from previous day: 06/17 0701 - 06/18 0700 In: 2510 [I.V.:2510] Out: 500 [Urine:500] Intake/Output this shift: Total I/O In: -  Out: 175 [Urine:175]  Physical exam:  Vital signs reviewed Patient appears in no acute distress No icterus no jaundice Abdomen is soft and minimally tender in the right upper quadrant nondistended nontympanitic no scars Calves are nontender  Lab Results: CBC   Recent Labs  05/23/17 1815 05/24/17 0420  WBC 12.1* 9.3  HGB 14.0 13.5  HCT 42.3 40.4  PLT 202 186   BMET  Recent Labs  05/24/17 0420 05/25/17 0447  NA 139 139  K 2.9* 4.0  CL 105 108  CO2 26 25  GLUCOSE 113* 101*  BUN 14 14  CREATININE 0.95 1.12  CALCIUM 8.9 8.6*   PT/INR No results for input(s): LABPROT, INR in the last 72 hours. ABG No results for input(s): PHART, HCO3 in the last 72 hours.  Invalid input(s): PCO2, PO2  Studies/Results: Ct Abdomen Pelvis W Contrast  Result Date: 05/23/2017 CLINICAL DATA:  Mid abdominal pain with emesis and elevated lipase EXAM: CT ABDOMEN AND PELVIS WITH CONTRAST TECHNIQUE: Multidetector CT imaging of the abdomen and pelvis was performed using the standard protocol following bolus administration of intravenous contrast. CONTRAST:  170mL ISOVUE-300 IOPAMIDOL (ISOVUE-300) INJECTION 61% COMPARISON:  None. FINDINGS: Lower chest: Lung bases demonstrate no acute consolidation or pleural  effusion. Borderline cardiomegaly. Hepatobiliary: No focal hepatic abnormality. Increased density within the gallbladder fundus suspect for small stones or sludge. There appears to be gallbladder wall thickening or edema. No biliary dilatation.Calcified liver granuloma. Pancreas: Moderate edema and inflammation around the head neck and body of pancreas concerning for acute pancreatitis. No focal fluid collections. Relatively homogeneously enhancement of the pancreas. Spleen: Normal in size without focal abnormality. Adrenals/Urinary Tract: Adrenal glands are unremarkable. Kidneys are normal, without renal calculi, focal lesion, or hydronephrosis. Bladder is unremarkable. Stomach/Bowel: The stomach is nonenlarged. Mild mucosal enhancement of the duodenum. No evidence for a bowel obstruction. No colon wall thickening. Normal appendix. Sigmoid colon diverticula without acute inflammation. Vascular/Lymphatic: Normal enhancement of the portal vessels and splenic vein. Aortic atherosclerosis. No aneurysmal dilatation. No significantly enlarged lymph nodes. Reproductive: Enlarged prostate with mass effect on the bladder Other: No free air. Fluid and soft tissue stranding within the anterior pararenal space. Musculoskeletal: No acute or suspicious bone lesion. IMPRESSION: 1. Moderate inflammatory change around the pancreas, concerning for an acute pancreatitis. No focal fluid collections are seen. 2. High density sludge or stones in the gallbladder. There appears to be gallbladder wall edema or surrounding fluid ; recommend correlation with right upper quadrant ultrasound. No biliary dilatation. 3. Mild mucosal enhancement and edematous strange around the duodenum could relate to associated duodenitis. Electronically Signed   By: Donavan Foil M.D.   On: 05/23/2017 22:18   US Abdomen Limited Ruq  Result Date: 05/24/2017 CLINICAL DATA:  Acute onset of mid abdominal pain. Recently  diagnosed pancreatitis. Initial  encounter. EXAM: ULTRASOUND ABDOMEN LIMITED RIGHT UPPER QUADRANT COMPARISON:  CT of the abdomen and pelvis performed earlier today at 9:45 p.m. FINDINGS: Gallbladder: Sludge is noted mostly filling the gallbladder, with associated small stones. Gallbladder wall thickening and pericholecystic fluid are noted. No ultrasonographic Murphy's sign is elicited, but this raises concern for cholecystitis. Common bile duct: Diameter: 0.7 cm, within normal limits for the patient's age. Liver: No focal lesion identified. Within normal limits in parenchymal echogenicity. A calcified granuloma is noted at the right hepatic lobe. IMPRESSION: Sludge noted mostly filling the gallbladder, with associated small stones. Underlying gallbladder wall thickening and pericholecystic fluid noted. Though no ultrasonographic Murphy's sign is elicited, findings are concerning for mild acute cholecystitis. Electronically Signed   By: Garald Balding M.D.   On: 05/24/2017 00:00    Anti-infectives: Anti-infectives    None      Assessment/Plan:  Patient is doing well and his liver function tests remain normal with a lipase which is normalized. It is believed that this is biliary in nature. I would recommend a laparoscopic cholecystectomy and we'll schedule him for tomorrow. I discussed with him the rationale for offering surgery and the options of continued observation and conservative management. I also discussed with him the risks of bleeding infection and need for a cholangiogram to conversion to an open procedure to avoid bile duct injury or bowel injury he understood and agreed to proceed  Florene Glen, MD, FACS  05/25/2017

## 2017-05-26 ENCOUNTER — Inpatient Hospital Stay: Payer: Medicare Other | Admitting: Anesthesiology

## 2017-05-26 ENCOUNTER — Encounter
Admission: EM | Disposition: A | Discharge status: Home / Self Care | Payer: Self-pay | Source: Home / Self Care | Attending: Internal Medicine

## 2017-05-26 ENCOUNTER — Inpatient Hospital Stay: Payer: Medicare Other

## 2017-05-26 ENCOUNTER — Encounter: Payer: Self-pay | Admitting: Anesthesiology

## 2017-05-26 HISTORY — PX: CHOLECYSTECTOMY: SHX55

## 2017-05-26 LAB — COMPREHENSIVE METABOLIC PANEL
ALK PHOS: 56 U/L (ref 38–126)
ALT: 21 U/L (ref 17–63)
ANION GAP: 8 (ref 5–15)
AST: 25 U/L (ref 15–41)
Albumin: 3.2 g/dL — ABNORMAL LOW (ref 3.5–5.0)
BILIRUBIN TOTAL: 1.4 mg/dL — AB (ref 0.3–1.2)
BUN: 12 mg/dL (ref 6–20)
CO2: 26 mmol/L (ref 22–32)
Calcium: 8.8 mg/dL — ABNORMAL LOW (ref 8.9–10.3)
Chloride: 104 mmol/L (ref 101–111)
Creatinine, Ser: 0.92 mg/dL (ref 0.61–1.24)
Glucose, Bld: 117 mg/dL — ABNORMAL HIGH (ref 65–99)
POTASSIUM: 3.4 mmol/L — AB (ref 3.5–5.1)
Sodium: 138 mmol/L (ref 135–145)
TOTAL PROTEIN: 7 g/dL (ref 6.5–8.1)

## 2017-05-26 LAB — CBC WITH DIFFERENTIAL/PLATELET
Basophils Absolute: 0 10*3/uL (ref 0–0.1)
Basophils Relative: 0 %
Eosinophils Absolute: 0.1 10*3/uL (ref 0–0.7)
Eosinophils Relative: 0 %
HCT: 39.5 % — ABNORMAL LOW (ref 40.0–52.0)
HEMOGLOBIN: 13.2 g/dL (ref 13.0–18.0)
LYMPHS ABS: 1.1 10*3/uL (ref 1.0–3.6)
Lymphocytes Relative: 8 %
MCH: 29.8 pg (ref 26.0–34.0)
MCHC: 33.4 g/dL (ref 32.0–36.0)
MCV: 89.2 fL (ref 80.0–100.0)
MONO ABS: 1 10*3/uL (ref 0.2–1.0)
Monocytes Relative: 8 %
NEUTROS ABS: 10.5 10*3/uL — AB (ref 1.4–6.5)
NEUTROS PCT: 84 %
Platelets: 180 10*3/uL (ref 150–440)
RBC: 4.43 MIL/uL (ref 4.40–5.90)
RDW: 13.4 % (ref 11.5–14.5)
WBC: 12.6 10*3/uL — ABNORMAL HIGH (ref 3.8–10.6)

## 2017-05-26 LAB — LIPASE, BLOOD: LIPASE: 39 U/L (ref 11–51)

## 2017-05-26 LAB — MAGNESIUM: MAGNESIUM: 2.1 mg/dL (ref 1.7–2.4)

## 2017-05-26 LAB — MRSA PCR SCREENING: MRSA BY PCR: NEGATIVE

## 2017-05-26 SURGERY — LAPAROSCOPIC CHOLECYSTECTOMY WITH INTRAOPERATIVE CHOLANGIOGRAM
Anesthesia: General | Site: Abdomen | Wound class: Contaminated

## 2017-05-26 MED ORDER — FENTANYL CITRATE (PF) 100 MCG/2ML IJ SOLN
25.0000 ug | INTRAMUSCULAR | Status: DC | PRN
  Administered 2017-05-26 (×2): 25 ug via INTRAVENOUS

## 2017-05-26 MED ORDER — SUCCINYLCHOLINE CHLORIDE 20 MG/ML IJ SOLN
INTRAMUSCULAR | Status: AC
  Filled 2017-05-26: qty 1

## 2017-05-26 MED ORDER — CEFAZOLIN SODIUM-DEXTROSE 1-4 GM/50ML-% IV SOLN
1.0000 g | Freq: Four times a day (QID) | INTRAVENOUS | Status: DC
  Administered 2017-05-26 – 2017-05-27 (×4): 1 g via INTRAVENOUS
  Filled 2017-05-26 (×7): qty 50

## 2017-05-26 MED ORDER — LIDOCAINE HCL (PF) 2 % IJ SOLN
INTRAMUSCULAR | Status: AC
  Filled 2017-05-26: qty 2

## 2017-05-26 MED ORDER — ACETAMINOPHEN 10 MG/ML IV SOLN
INTRAVENOUS | Status: DC | PRN
  Administered 2017-05-26: 1000 mg via INTRAVENOUS

## 2017-05-26 MED ORDER — LACTATED RINGERS IV SOLN
INTRAVENOUS | Status: DC | PRN
  Administered 2017-05-26: 07:00:00 via INTRAVENOUS

## 2017-05-26 MED ORDER — ONDANSETRON HCL 4 MG/2ML IJ SOLN
INTRAMUSCULAR | Status: AC
  Filled 2017-05-26: qty 2

## 2017-05-26 MED ORDER — PROPOFOL 10 MG/ML IV BOLUS
INTRAVENOUS | Status: DC | PRN
  Administered 2017-05-26: 120 mg via INTRAVENOUS

## 2017-05-26 MED ORDER — FENTANYL CITRATE (PF) 100 MCG/2ML IJ SOLN
INTRAMUSCULAR | Status: AC
  Filled 2017-05-26: qty 2

## 2017-05-26 MED ORDER — LIDOCAINE HCL (CARDIAC) 20 MG/ML IV SOLN
INTRAVENOUS | Status: DC | PRN
  Administered 2017-05-26: 80 mg via INTRAVENOUS

## 2017-05-26 MED ORDER — ACETAMINOPHEN 10 MG/ML IV SOLN
INTRAVENOUS | Status: AC
  Filled 2017-05-26: qty 100

## 2017-05-26 MED ORDER — ONDANSETRON HCL 4 MG/2ML IJ SOLN
INTRAMUSCULAR | Status: DC | PRN
  Administered 2017-05-26: 4 mg via INTRAVENOUS

## 2017-05-26 MED ORDER — ROCURONIUM BROMIDE 100 MG/10ML IV SOLN
INTRAVENOUS | Status: DC | PRN
  Administered 2017-05-26: 35 mg via INTRAVENOUS
  Administered 2017-05-26: 10 mg via INTRAVENOUS
  Administered 2017-05-26: 5 mg via INTRAVENOUS

## 2017-05-26 MED ORDER — CEFAZOLIN SODIUM 1 G IJ SOLR
INTRAMUSCULAR | Status: DC | PRN
  Administered 2017-05-26: 1 g via INTRAMUSCULAR

## 2017-05-26 MED ORDER — DEXAMETHASONE SODIUM PHOSPHATE 10 MG/ML IJ SOLN
INTRAMUSCULAR | Status: DC | PRN
  Administered 2017-05-26: 4 mg via INTRAVENOUS

## 2017-05-26 MED ORDER — POTASSIUM CHLORIDE 20 MEQ PO PACK
40.0000 meq | PACK | Freq: Once | ORAL | Status: AC
  Administered 2017-05-26: 40 meq via ORAL
  Filled 2017-05-26: qty 2

## 2017-05-26 MED ORDER — BUPIVACAINE-EPINEPHRINE (PF) 0.25% -1:200000 IJ SOLN
INTRAMUSCULAR | Status: DC | PRN
  Administered 2017-05-26: 30 mL

## 2017-05-26 MED ORDER — FENTANYL CITRATE (PF) 100 MCG/2ML IJ SOLN
INTRAMUSCULAR | Status: DC | PRN
  Administered 2017-05-26: 50 ug via INTRAVENOUS
  Administered 2017-05-26: 100 ug via INTRAVENOUS
  Administered 2017-05-26: 25 ug via INTRAVENOUS

## 2017-05-26 MED ORDER — PROPOFOL 10 MG/ML IV BOLUS
INTRAVENOUS | Status: AC
  Filled 2017-05-26: qty 20

## 2017-05-26 MED ORDER — IOTHALAMATE MEGLUMINE 60 % INJ SOLN
INTRAMUSCULAR | Status: DC | PRN
  Administered 2017-05-26: 30 mL via SURGICAL_CAVITY

## 2017-05-26 MED ORDER — FENTANYL CITRATE (PF) 100 MCG/2ML IJ SOLN
INTRAMUSCULAR | Status: AC
  Administered 2017-05-26: 25 ug via INTRAVENOUS
  Filled 2017-05-26: qty 2

## 2017-05-26 MED ORDER — SUGAMMADEX SODIUM 200 MG/2ML IV SOLN
INTRAVENOUS | Status: DC | PRN
  Administered 2017-05-26: 170 mg via INTRAVENOUS

## 2017-05-26 MED ORDER — SUGAMMADEX SODIUM 200 MG/2ML IV SOLN
INTRAVENOUS | Status: AC
  Filled 2017-05-26: qty 2

## 2017-05-26 MED ORDER — ONDANSETRON HCL 4 MG/2ML IJ SOLN: 4.0000 mg | Freq: Once | INTRAMUSCULAR | Status: DC | PRN

## 2017-05-26 MED ORDER — SUCCINYLCHOLINE CHLORIDE 20 MG/ML IJ SOLN
INTRAMUSCULAR | Status: DC | PRN
  Administered 2017-05-26: 100 mg via INTRAVENOUS

## 2017-05-26 MED ORDER — ROCURONIUM BROMIDE 50 MG/5ML IV SOLN
INTRAVENOUS | Status: AC
  Filled 2017-05-26: qty 1

## 2017-05-26 MED ORDER — OXYCODONE HCL 5 MG PO TABS
5.0000 mg | ORAL_TABLET | ORAL | Status: DC | PRN
  Administered 2017-05-26 – 2017-05-29 (×12): 5 mg via ORAL
  Filled 2017-05-26 (×12): qty 1

## 2017-05-26 MED ORDER — PHENYLEPHRINE HCL 10 MG/ML IJ SOLN
INTRAMUSCULAR | Status: AC
  Filled 2017-05-26: qty 1

## 2017-05-26 SURGICAL SUPPLY — 43 items
ADHESIVE MASTISOL STRL (MISCELLANEOUS) ×3 IMPLANT
APPLIER CLIP ROT 10 11.4 M/L (STAPLE) ×3
BLADE SURG SZ11 CARB STEEL (BLADE) ×3 IMPLANT
CANISTER SUCT 1200ML W/VALVE (MISCELLANEOUS) ×3 IMPLANT
CATH CHOLANGI 4FR 420404F (CATHETERS) ×3 IMPLANT
CHLORAPREP W/TINT 26ML (MISCELLANEOUS) ×3 IMPLANT
CLIP APPLIE ROT 10 11.4 M/L (STAPLE) ×1 IMPLANT
CLOSURE WOUND 1/2 X4 (GAUZE/BANDAGES/DRESSINGS) ×1
CONRAY 60ML FOR OR (MISCELLANEOUS) IMPLANT
DRAPE C-ARM XRAY 36X54 (DRAPES) ×3 IMPLANT
ELECT REM PT RETURN 9FT ADLT (ELECTROSURGICAL) ×3
ELECTRODE REM PT RTRN 9FT ADLT (ELECTROSURGICAL) ×1 IMPLANT
ENDOPOUCH RETRIEVER 10 (MISCELLANEOUS) ×3 IMPLANT
GAUZE SPONGE NON-WVN 2X2 STRL (MISCELLANEOUS) ×4 IMPLANT
GLOVE BIO SURGEON STRL SZ8 (GLOVE) ×3 IMPLANT
GOWN STRL REUS W/ TWL LRG LVL3 (GOWN DISPOSABLE) ×4 IMPLANT
GOWN STRL REUS W/TWL LRG LVL3 (GOWN DISPOSABLE) ×8
IRRIGATION STRYKERFLOW (MISCELLANEOUS) ×1 IMPLANT
IRRIGATOR STRYKERFLOW (MISCELLANEOUS) ×3
IV CATH ANGIO 12GX3 LT BLUE (NEEDLE) ×3 IMPLANT
IV NS 1000ML (IV SOLUTION) ×2
IV NS 1000ML BAXH (IV SOLUTION) ×1 IMPLANT
JACKSON PRATT 10 (INSTRUMENTS) ×3 IMPLANT
KIT RM TURNOVER STRD PROC AR (KITS) ×3 IMPLANT
LABEL OR SOLS (LABEL) ×3 IMPLANT
NDL SAFETY 22GX1.5 (NEEDLE) ×3 IMPLANT
NEEDLE VERESS 14GA 120MM (NEEDLE) ×3 IMPLANT
NS IRRIG 500ML POUR BTL (IV SOLUTION) ×3 IMPLANT
PACK LAP CHOLECYSTECTOMY (MISCELLANEOUS) ×3 IMPLANT
SCISSORS METZENBAUM CVD 33 (INSTRUMENTS) ×3 IMPLANT
SLEEVE ENDOPATH XCEL 5M (ENDOMECHANICALS) ×6 IMPLANT
SPONGE LAP 18X18 5 PK (GAUZE/BANDAGES/DRESSINGS) ×3 IMPLANT
SPONGE VERSALON 2X2 STRL (MISCELLANEOUS) ×8
SPONGE VERSALON 4X4 4PLY (MISCELLANEOUS) ×3 IMPLANT
STRIP CLOSURE SKIN 1/2X4 (GAUZE/BANDAGES/DRESSINGS) ×2 IMPLANT
SUT MNCRL 4-0 (SUTURE) ×2
SUT MNCRL 4-0 27XMFL (SUTURE) ×1
SUT VICRYL 0 AB UR-6 (SUTURE) ×3 IMPLANT
SUTURE MNCRL 4-0 27XMF (SUTURE) ×1 IMPLANT
SYR 20CC LL (SYRINGE) ×3 IMPLANT
TROCAR XCEL NON-BLD 11X100MML (ENDOMECHANICALS) ×3 IMPLANT
TROCAR XCEL NON-BLD 5MMX100MML (ENDOMECHANICALS) ×3 IMPLANT
TUBING INSUFFLATOR HI FLOW (MISCELLANEOUS) ×3 IMPLANT

## 2017-05-26 NOTE — Progress Notes (Signed)
Chaplain rounding unit visited with pt. Pt stated he was doing much better after surgery but still have pain. Pt was very pleased that his nephew and niece visited with him and offered him prayers. Pt stated he does not have children of his own, but his brothers have many children. Pt asked chaplain to prayer for his full recovery, which [place] did. Ch also informed pt that pastoral services were available as needed.   05/26/17 1400  Clinical Encounter Type  Visited With Patient  Visit Type Initial;Spiritual support  Referral From [place]

## 2017-05-26 NOTE — Anesthesia Procedure Notes (Signed)
Procedure Name: Intubation Performed by: Lance Muss Pre-anesthesia Checklist: Patient identified, Patient being monitored, Timeout performed, Emergency Drugs available and Suction available Patient Re-evaluated:Patient Re-evaluated prior to inductionOxygen Delivery Method: Circle system utilized Preoxygenation: Pre-oxygenation with 100% oxygen Intubation Type: IV induction Ventilation: Mask ventilation without difficulty and Oral airway inserted - appropriate to patient size Laryngoscope Size: Mac and 3 Grade View: Grade I Tube type: Oral Tube size: 7.5 mm Number of attempts: 1 Airway Equipment and Method: Stylet Placement Confirmation: ETT inserted through vocal cords under direct vision,  positive ETCO2 and breath sounds checked- equal and bilateral Secured at: 22 cm Tube secured with: Tape Dental Injury: Teeth and Oropharynx as per pre-operative assessment

## 2017-05-26 NOTE — Anesthesia Preprocedure Evaluation (Signed)
Anesthesia Evaluation  Patient identified by MRN, date of birth, ID band Patient awake    Reviewed: Allergy & Precautions  Airway Mallampati: II       Dental  (+) Upper Dentures, Partial Lower   Pulmonary asthma , COPD, former smoker,     + decreased breath sounds      Cardiovascular hypertension, Pt. on medications + Peripheral Vascular Disease   Rhythm:Regular     Neuro/Psych negative psych ROS   GI/Hepatic Neg liver ROS, GERD  ,  Endo/Other  negative endocrine ROS  Renal/GU negative Renal ROS     Musculoskeletal negative musculoskeletal ROS (+)   Abdominal Normal abdominal exam  (+)   Peds  Hematology negative hematology ROS (+)   Anesthesia Other Findings Past Medical History: No date: Asthma No date: BPH (benign prostatic hypertrophy) No date: Brain aneurysm No date: Chronic prostatitis No date: Elevated PSA No date: GERD (gastroesophageal reflux disease) No date: HTN (hypertension)  Reproductive/Obstetrics                             Anesthesia Physical  Anesthesia Plan  ASA: III  Anesthesia Plan: General   Post-op Pain Management:    Induction: Intravenous  PONV Risk Score and Plan: 3 and Ondansetron, Dexamethasone, Propofol and Midazolam  Airway Management Planned: Oral ETT  Additional Equipment:   Intra-op Plan:   Post-operative Plan: Extubation in OR  Informed Consent: I have reviewed the patients History and Physical, chart, labs and discussed the procedure including the risks, benefits and alternatives for the proposed anesthesia with the patient or authorized representative who has indicated his/her understanding and acceptance.   Dental advisory given  Plan Discussed with: CRNA and Surgeon  Anesthesia Plan Comments:         Anesthesia Quick Evaluation

## 2017-05-26 NOTE — Op Note (Signed)
Laparoscopic Cholecystectomy  Pre-operative Diagnosis: Biliary pancreatitis  Post-operative Diagnosis: Biliary pancreatitis  Procedure: Laparoscopic cholecystectomy with C-arm fluoroscopic angiography  Surgeon: Jerrol Banana. Burt Knack, MD FACS  Anesthesia: Gen. with endotracheal tube  Assistant: Surgical RN  Procedure Details  The patient was seen again in the Holding Room. The benefits, complications, treatment options, and expected outcomes were discussed with the patient. The risks of bleeding, infection, recurrence of symptoms, failure to resolve symptoms, bile duct damage, bile duct leak, retained common bile duct stone, bowel injury, any of which could require further surgery and/or ERCP, stent, or papillotomy were reviewed with the patient. The likelihood of improving the patient's symptoms with return to their baseline status is good.  The patient and/or family concurred with the proposed plan, giving informed consent.  The patient was taken to Operating Room, identified as [facility] and the procedure verified as Laparoscopic Cholecystectomy.  A Time Out was held and the above information confirmed.  Prior to the induction of general anesthesia, antibiotic prophylaxis was administered. VTE prophylaxis was in place. General endotracheal anesthesia was then administered and tolerated well. After the induction, the abdomen was prepped with Chloraprep and draped in the sterile fashion. The patient was positioned in the supine position.  Local anesthetic  was injected into the skin near the umbilicus and an incision made. The Veress needle was placed. Pneumoperitoneum was then created with CO2 and tolerated well without any adverse changes in the patient's vital signs. A 11mm port was placed in the periumbilical position and the abdominal cavity was explored.  Two 5-mm ports were placed in the right upper quadrant and a 12 mm epigastric port was placed all under direct vision. All skin incisions   were infiltrated with a local anesthetic agent before making the incision and placing the trocars.   The patient was positioned  in reverse Trendelenburg, tilted slightly to the patient's left.  The gallbladder was identified, the fundus grasped and retracted cephalad. Adhesions were lysed bluntly. The infundibulum was grasped and retracted laterally, exposing the peritoneum overlying the triangle of Calot. This was then divided and exposed in a blunt fashion. A critical view of the cystic duct and cystic artery was obtained.  The cystic duct was clearly identified and bluntly dissected.   The cystic duct was clipped and incised and through a separate incision and Angiocath a cholangiocatheter was placed. C-arm fluoroscopic angiography initially showed the cystic duct had been cannulated and just distal to the cystic duct there was very little flow and it appeared to be a sludge or stone obstructing the proximal common bile duct. The proximal ducts including the common hepatic and right and left bile ducts initially filled and then with additional pressure contrast flowed past this relative obstruction and into a narrowed duct and into the duodenum. Further evaluation demonstrated what appeared to be sludge in the bile duct but no obvious stone.  At this point the cholangiocatheter was removed the cystic duct was doubly clipped and divided. The cystic artery was doubly clipped and divided and the gallbladder was taken from the gallbladder fossa with electrocautery and in so doing a lateral artery was triply clipped and divided.  The gallbladder was taken from the gallbladder fossa in a retrograde fashion with the electrocautery. Bile within the gallbladder was noted to be frankly purulent. The gallbladder was removed and placed in an Endocatch bag. The liver bed was irrigated and inspected. Hemostasis was achieved with the electrocautery. Copious irrigation was utilized and was repeatedly aspirated  until  clear.  The gallbladder and Endocatch sac were then removed through the epigastric port site.   Through the lateral port site was placed a 10 mm JP drain into the foramen of Winslow and tied in with 3-0 nylon.  Inspection of the right upper quadrant was performed. No bleeding, bile duct injury or leak, or bowel injury was noted. Pneumoperitoneum was released.  The epigastric port site was closed with figure-of-eight 0 Vicryl sutures. 4-0 subcuticular Monocryl was used to close the skin. Steristrips and Mastisol and sterile dressings were  applied.  The patient was then extubated and brought to the recovery room in stable condition. Sponge, lap, and needle counts were correct at closure and at the conclusion of the case.   Findings: Acute* Cholecystitis with common bile duct sludge  Estimated Blood Loss: Minimal         Drains: JP 1         Specimens: Gallbladder           Complications: none               Bow Buntyn E. Burt Knack, MD, FACS

## 2017-05-26 NOTE — Progress Notes (Signed)
CC: Abdominal pain Subjective: This patient with biliary pancreatitis whose pain is much improved. He has no nausea vomiting fevers or chills today.  Objective: Vital signs in last 24 hours: Temp:  [98.7 F (37.1 C)-100.2 F (37.9 C)] 99.1 F (37.3 C) (06/19 0501) Pulse Rate:  [81-97] 91 (06/19 0501) Resp:  [18-22] 22 (06/19 0501) BP: (118-156)/(61-71) 154/61 (06/19 0501) SpO2:  [93 %-95 %] 93 % (06/19 0501) Last BM Date: 05/23/17  Intake/Output from previous day: 06/18 0701 - 06/19 0700 In: 240 [P.O.:240] Out: 175 [Urine:175] Intake/Output this shift: No intake/output data recorded.  Physical exam:  Vital signs are stable afebrile no icterus no jaundice abdomen is soft and nontender nontender calves  Lab Results: CBC   Recent Labs  05/24/17 0420 05/26/17 0438  WBC 9.3 12.6*  HGB 13.5 13.2  HCT 40.4 39.5*  PLT 186 180   BMET  Recent Labs  05/25/17 0447 05/26/17 0438  NA 139 138  K 4.0 3.4*  CL 108 104  CO2 25 26  GLUCOSE 101* 117*  BUN 14 12  CREATININE 1.12 0.92  CALCIUM 8.6* 8.8*   PT/INR No results for input(s): LABPROT, INR in the last 72 hours. ABG No results for input(s): PHART, HCO3 in the last 72 hours.  Invalid input(s): PCO2, PO2  Studies/Results: No results found.  Anti-infectives: Anti-infectives    None      Assessment/Plan:  Labs are personally reviewed LFTs are normal lipase is normal. Recommend laparoscopic cholecystectomy for today to avoid future biliary pancreatitis. The rationale for this recommendation was discussed with the patient the options of observation reviewed and the risks of bleeding infection recurrence of symptoms failure to resolve his symptoms conversion to an open procedure bile duct damage bile duct leak retained common bile duct stones were all reviewed with him he understood and agreed to proceed  Florene Glen, MD, FACS  05/26/2017

## 2017-05-26 NOTE — Anesthesia Postprocedure Evaluation (Signed)
Anesthesia Post Note  Patient: Anthony Meyers  Procedure(s) Performed: Procedure(s) (LRB): LAPAROSCOPIC CHOLECYSTECTOMY WITH INTRAOPERATIVE CHOLANGIOGRAM (N/A)  Patient location during evaluation: PACU Anesthesia Type: General Level of consciousness: awake and alert and oriented Pain management: pain level controlled Vital Signs Assessment: post-procedure vital signs reviewed and stable Respiratory status: spontaneous breathing Cardiovascular status: blood pressure returned to baseline Anesthetic complications: no     Last Vitals:  Vitals:   05/26/17 1057 05/26/17 1220  BP: (!) 152/64 (!) 150/63  Pulse: 88 82  Resp: 18 20  Temp: 36.7 C 36.9 C    Last Pain:  Vitals:   05/26/17 1220  TempSrc: Oral  PainSc:                  Jessel Gettinger

## 2017-05-26 NOTE — Progress Notes (Signed)
[facility] at [place] NAME: Anthony Meyers    MR#:  [id number]  DATE OF BIRTH:  03-27-1946  SUBJECTIVE:  CHIEF COMPLAINT:   Chief Complaint  Patient presents with  . Abdominal Pain   -His abdominal pain is significantly improved, was taken for laparoscopy cholecystectomy, tolerated surgery well, started on liquid diet again.  REVIEW OF SYSTEMS:  Review of Systems  Constitutional: Negative for chills, fever and malaise/fatigue.  HENT: Negative for hearing loss and tinnitus.   Eyes: Negative for blurred vision and double vision.  Respiratory: Negative for cough, shortness of breath and wheezing.   Cardiovascular: Negative for chest pain, palpitations and leg swelling.  Gastrointestinal: Positive for abdominal pain and nausea. Negative for constipation, diarrhea and vomiting.  Genitourinary: Negative for dysuria.  Musculoskeletal: Negative for myalgias.  Neurological: Negative for dizziness, sensory change, speech change, focal weakness, seizures and headaches.  Psychiatric/Behavioral: Negative for depression.    DRUG ALLERGIES:  No Known Allergies  VITALS:  Blood pressure (!) 150/63, pulse 82, temperature 98.4 F (36.9 C), temperature source Oral, resp. rate 20, height 5\' 9"  (1.753 m), weight 83.6 kg (184 lb 3.2 oz), SpO2 95 %.  PHYSICAL EXAMINATION:  Physical Exam  GENERAL:  71 y.o.-year-old patient lying in the bed with no acute distress.  EYES: Pupils equal, round, reactive to light and accommodation. No scleral icterus. Extraocular muscles intact.  HEENT: Head atraumatic, normocephalic. Oropharynx and nasopharynx clear.  NECK:  Supple, no jugular venous distention. No thyroid enlargement, no tenderness.  LUNGS: Normal breath sounds bilaterally, no wheezing, rales,rhonchi or crepitation. No use of accessory muscles of respiration.  CARDIOVASCULAR: S1, S2 normal. No murmurs, rubs, or gallops.  ABDOMEN: Soft, tender in  periumbilical, nondistended. Bowel sounds present. No organomegaly or mass. Drainage tube from RUQ present. EXTREMITIES: No pedal edema, cyanosis, or clubbing.  NEUROLOGIC: Cranial nerves II through XII are intact. Muscle strength 5/5 in all extremities. Sensation intact. Gait not checked.  PSYCHIATRIC: The patient is alert and oriented x 3.  SKIN: No obvious rash, lesion, or ulcer.    LABORATORY PANEL:   CBC  Recent Labs Lab 05/26/17 0438  WBC 12.6*  HGB 13.2  HCT 39.5*  PLT 180   ------------------------------------------------------------------------------------------------------------------  Chemistries   Recent Labs Lab 05/26/17 0438  NA 138  K 3.4*  CL 104  CO2 26  GLUCOSE 117*  BUN 12  CREATININE 0.92  CALCIUM 8.8*  AST 25  ALT 21  ALKPHOS 56  BILITOT 1.4*   ------------------------------------------------------------------------------------------------------------------  Cardiac Enzymes No results for input(s): TROPONINI in the last 168 hours. ------------------------------------------------------------------------------------------------------------------  RADIOLOGY:  Dg Cholangiogram Operative  Result Date: 05/26/2017 CLINICAL DATA:  71 year old male with cholecystitis EXAM: INTRAOPERATIVE CHOLANGIOGRAM TECHNIQUE: Cholangiographic images from the C-arm fluoroscopic device were submitted for interpretation post-operatively. Please see the procedural report for the amount of contrast and the fluoroscopy time utilized. COMPARISON:  Abdominal ultrasound 05/23/2017 FINDINGS: A cine image obtained at the time of intraoperative cholangiogram during laparoscopic cholecystectomy demonstrates cannulation of the cystic duct remanent and opacification of the biliary tree. No biliary ductal dilatation, stenosis, stricture or choledocholithiasis. Contrast material passes freely through the ampulla and into the duodenum. IMPRESSION: Negative intraoperative cholangiogram.  Electronically Signed   By: Jacqulynn Cadet M.D.   On: 05/26/2017 08:31    EKG:   Orders placed or performed in visit on 05/19/07  . EKG 12-Lead    ASSESSMENT AND PLAN:   71 year old male with past medical history significant  for hypertension, GERD, BPH admitted for abdominal pain and noted to have acute gallstone pancreatitis.  #1 acute gallstone pancreatitis-CT of the abdomen with thicken gallbladder wall with sludge versus stones and elevated lipase. -Symptoms are better today.  lipase is improving. -Continue nothing by mouth status, IV fluids. Now started full liquid diet today. -Hold hydrochlorothiazide -Appreciate surgical consult. Pancreatitis improved. - laproscopic cholecystectomy done 05/26/17  #2 hypokalemia-being replaced aggressively, monitor   Check mg today.  #3 asthma-stable. Continue home inhalers.  #4 GERD-currently on Protonix  #5 hypertension-hold hydrochlorothiazide   All the records are reviewed and case discussed with Care Management/Social Workerr. Management plans discussed with the patient, family and they are in agreement.  CODE STATUS: Full Code  TOTAL TIME TAKING CARE OF THIS PATIENT: 35 minutes.   POSSIBLE D/C IN 1-2DAYS, DEPENDING ON CLINICAL CONDITION.   Vaughan Basta M.D on 05/26/2017 at 1:55 PM  Between 7am to 6pm - Pager - [phone]  After 6pm go to [email or web address] - password [email or web address] [facility]  Office  [phone]  CC: Primary care physician; [place] Pink, MD

## 2017-05-26 NOTE — Transfer of Care (Signed)
Immediate Anesthesia Transfer of Care Note  Patient: Anthony Meyers  Procedure(s) Performed: Procedure(s): LAPAROSCOPIC CHOLECYSTECTOMY WITH INTRAOPERATIVE CHOLANGIOGRAM (N/A)  Patient Location: PACU  Anesthesia Type:General  Level of Consciousness: awake and responds to stimulation  Airway & Oxygen Therapy: Patient Spontanous Breathing and Patient connected to face mask oxygen  Post-op Assessment: Report given to RN and Post -op Vital signs reviewed and stable  Post vital signs: Reviewed and stable  Last Vitals:  Vitals:   05/26/17 0501 05/26/17 0837  BP: (!) 154/61 (!) 153/66  Pulse: 91 (!) 125  Resp: (!) 22   Temp: 37.3 C     Last Pain:  Vitals:   05/26/17 0501  TempSrc: Oral  PainSc: 0-No pain      Patients Stated Pain Goal: 2 (02/58/52 7782)  Complications: No apparent anesthesia complications

## 2017-05-26 NOTE — Anesthesia Post-op Follow-up Note (Cosign Needed)
Anesthesia QCDR form completed.        

## 2017-05-27 LAB — CBC WITH DIFFERENTIAL/PLATELET
Basophils Absolute: 0 10*3/uL (ref 0–0.1)
Basophils Relative: 0 %
EOS ABS: 0 10*3/uL (ref 0–0.7)
Eosinophils Relative: 0 %
HCT: 36.7 % — ABNORMAL LOW (ref 40.0–52.0)
HEMOGLOBIN: 12.4 g/dL — AB (ref 13.0–18.0)
LYMPHS ABS: 0.9 10*3/uL — AB (ref 1.0–3.6)
Lymphocytes Relative: 9 %
MCH: 29.3 pg (ref 26.0–34.0)
MCHC: 33.7 g/dL (ref 32.0–36.0)
MCV: 87.1 fL (ref 80.0–100.0)
Monocytes Absolute: 0.7 10*3/uL (ref 0.2–1.0)
Monocytes Relative: 8 %
NEUTROS PCT: 83 %
Neutro Abs: 7.7 10*3/uL — ABNORMAL HIGH (ref 1.4–6.5)
Platelets: 183 10*3/uL (ref 150–440)
RBC: 4.21 MIL/uL — AB (ref 4.40–5.90)
RDW: 13.2 % (ref 11.5–14.5)
WBC: 9.3 10*3/uL (ref 3.8–10.6)

## 2017-05-27 LAB — COMPREHENSIVE METABOLIC PANEL
ALBUMIN: 2.9 g/dL — AB (ref 3.5–5.0)
ALK PHOS: 71 U/L (ref 38–126)
ALT: 38 U/L (ref 17–63)
AST: 49 U/L — ABNORMAL HIGH (ref 15–41)
Anion gap: 8 (ref 5–15)
BUN: 12 mg/dL (ref 6–20)
CALCIUM: 8.6 mg/dL — AB (ref 8.9–10.3)
CO2: 24 mmol/L (ref 22–32)
CREATININE: 0.64 mg/dL (ref 0.61–1.24)
Chloride: 101 mmol/L (ref 101–111)
GFR calc non Af Amer: >60 mL/min (ref >60)
Glucose, Bld: 122 mg/dL — ABNORMAL HIGH (ref 65–99)
Potassium: 3.7 mmol/L (ref 3.5–5.1)
SODIUM: 133 mmol/L — AB (ref 135–145)
Total Bilirubin: 1.2 mg/dL (ref 0.3–1.2)
Total Protein: 6.7 g/dL (ref 6.5–8.1)

## 2017-05-27 LAB — LIPASE, BLOOD: Lipase: 39 U/L (ref 11–51)

## 2017-05-27 MED ORDER — CEPHALEXIN 500 MG PO CAPS
500.0000 mg | ORAL_CAPSULE | Freq: Three times a day (TID) | ORAL | Status: DC
  Administered 2017-05-27 – 2017-05-29 (×7): 500 mg via ORAL
  Filled 2017-05-27 (×7): qty 1

## 2017-05-27 NOTE — Progress Notes (Signed)
1 Day Post-Op  Subjective: Status post laparoscopic cholecystectomy for empyema of the gallbladder. Patient is doing well tolerating a clear liquid diet with no fevers or chills  Objective: Vital signs in last 24 hours: Temp:  [98.1 F (36.7 C)-100 F (37.8 C)] 98.6 F (37 C) (06/20 0830) Pulse Rate:  [82-97] 90 (06/20 0830) Resp:  [16-20] 16 (06/20 0421) BP: (140-152)/(62-70) 140/66 (06/20 0830) SpO2:  [93 %-96 %] 94 % (06/20 0830) Last BM Date: 05/23/17  Intake/Output from previous day: 06/19 0701 - 06/20 0700 In: 1030 [P.O.:480; I.V.:500; IV Piggyback:50] Out: 1045 [Urine:850; Drains:155; Blood:10] Intake/Output this shift: Total I/O In: 760 [P.O.:760] Out: 200 [Urine:200]  Physical exam:  Vital signs are stable afebrile No bile in drain Abdomen is soft nontender wounds are clean  Lab Results: CBC   Recent Labs  05/26/17 0438 05/27/17 0429  WBC 12.6* 9.3  HGB 13.2 12.4*  HCT 39.5* 36.7*  PLT 180 183   BMET  Recent Labs  05/26/17 0438 05/27/17 0429  NA 138 133*  K 3.4* 3.7  CL 104 101  CO2 26 24  GLUCOSE 117* 122*  BUN 12 12  CREATININE 0.92 0.64  CALCIUM 8.8* 8.6*   PT/INR No results for input(s): LABPROT, INR in the last 72 hours. ABG No results for input(s): PHART, HCO3 in the last 72 hours.  Invalid input(s): PCO2, PO2  Studies/Results: Dg Cholangiogram Operative  Result Date: 05/26/2017 CLINICAL DATA:  71 year old male with cholecystitis EXAM: INTRAOPERATIVE CHOLANGIOGRAM TECHNIQUE: Cholangiographic images from the C-arm fluoroscopic device were submitted for interpretation post-operatively. Please see the procedural report for the amount of contrast and the fluoroscopy time utilized. COMPARISON:  Abdominal ultrasound 05/23/2017 FINDINGS: A cine image obtained at the time of intraoperative cholangiogram during laparoscopic cholecystectomy demonstrates cannulation of the cystic duct remanent and opacification of the biliary tree. No biliary  ductal dilatation, stenosis, stricture or choledocholithiasis. Contrast material passes freely through the ampulla and into the duodenum. IMPRESSION: Negative intraoperative cholangiogram. Electronically Signed   By: Jacqulynn Cadet M.D.   On: 05/26/2017 08:31    Anti-infectives: Anti-infectives    Start     Dose/Rate Route Frequency Ordered Stop   05/27/17 1100  cephALEXin (KEFLEX) capsule 500 mg     500 mg Oral Every 8 hours 05/27/17 1052     05/26/17 1200  ceFAZolin (ANCEF) IVPB 1 g/50 mL premix  Status:  Discontinued     1 g 100 mL/hr over 30 Minutes Intravenous Every 6 hours 05/26/17 0927 05/27/17 1055      Assessment/Plan: s/p Procedure(s): LAPAROSCOPIC CHOLECYSTECTOMY WITH INTRAOPERATIVE CHOLANGIOGRAM   LFTs and white blood cell count reviewed. Discussed with Dr. Darvin Neighbours about continuing antibiotics as an outpatient once he goes home tomorrow. We'll discontinue the Ancef and start Keflex today. Advance diet and probably home tomorrow  Florene Glen, MD, FACS  05/27/2017

## 2017-05-27 NOTE — Progress Notes (Signed)
Chaplain made a follow up visit with pt. Pt was sitting on the bed watching tv, pt's friend was at the bedside. Pt stated he was doing better, but had a slight pain that was manageable. Pt talked about his brothers who passed away, his sisters and the interpersonal relationships he have been involved in, and his close relationship with his friend who was in the Rm. Pt asked prayer for healing, which, the [facility] provided.  [facility] to follow up this pt as needed.    05/27/17 1400  Clinical Encounter Type  Visited With Patient;Family  Visit Type Follow-up;Spiritual support  Referral From [facility]

## 2017-05-27 NOTE — Progress Notes (Signed)
[facility] Meyers at [place] NAME: Anthony Meyers    MR#:  [id number]  DATE OF BIRTH:  07-09-1946  SUBJECTIVE:  CHIEF COMPLAINT:   Chief Complaint  Patient presents with  . Abdominal Pain   Patient admitted for gallstone pancreatitis. Had laparoscopic cholecystectomy on 05/26/2017. A JP drain in place. Mild pain at the surgical site. Tolerating clear liquids. Temperature max of 100F in the last 24 hours.  On IV Ancef.  REVIEW OF SYSTEMS:  Review of Systems  Constitutional: Negative for chills, fever and malaise/fatigue.  HENT: Negative for hearing loss and tinnitus.   Eyes: Negative for blurred vision and double vision.  Respiratory: Negative for cough, shortness of breath and wheezing.   Cardiovascular: Negative for chest pain, palpitations and leg swelling.  Gastrointestinal: Positive for abdominal pain and nausea. Negative for constipation, diarrhea and vomiting.  Genitourinary: Negative for dysuria.  Musculoskeletal: Negative for myalgias.  Neurological: Negative for dizziness, sensory change, speech change, focal weakness, seizures and headaches.  Psychiatric/Behavioral: Negative for depression.    DRUG ALLERGIES:  No Known Allergies  VITALS:  Blood pressure 140/66, pulse 90, temperature 98.6 F (37 C), temperature source Oral, resp. rate 16, height 5\' 9"  (1.753 m), weight 83.6 kg (184 lb 3.2 oz), SpO2 94 %.  PHYSICAL EXAMINATION:  Physical Exam  GENERAL:  71 y.o.-year-old patient lying in the bed with no acute distress.  EYES: Pupils equal, round, reactive to light and accommodation. No scleral icterus. Extraocular muscles intact.  HEENT: Head atraumatic, normocephalic. Oropharynx and nasopharynx clear.  NECK:  Supple, no jugular venous distention. No thyroid enlargement, no tenderness.  LUNGS: Normal breath sounds bilaterally, no wheezing, rales,rhonchi or crepitation. No use of accessory muscles of respiration.    CARDIOVASCULAR: S1, S2 normal. No murmurs, rubs, or gallops.  ABDOMEN: Soft, tender, nondistended. Bowel sounds present. No organomegaly or mass. Drainage tube from RUQ present. EXTREMITIES: No pedal edema, cyanosis, or clubbing.  NEUROLOGIC: Cranial nerves II through XII are intact. Muscle strength 5/5 in all extremities. Sensation intact. Gait not checked.  PSYCHIATRIC: The patient is alert and oriented x 3.  SKIN: No obvious rash, lesion, or ulcer.    LABORATORY PANEL:   CBC  Recent Labs Lab 05/27/17 0429  WBC 9.3  HGB 12.4*  HCT 36.7*  PLT 183   ------------------------------------------------------------------------------------------------------------------  Chemistries   Recent Labs Lab 05/26/17 0438 05/27/17 0429  NA 138 133*  K 3.4* 3.7  CL 104 101  CO2 26 24  GLUCOSE 117* 122*  BUN 12 12  CREATININE 0.92 0.64  CALCIUM 8.8* 8.6*  MG 2.1  --   AST 25 49*  ALT 21 38  ALKPHOS 56 71  BILITOT 1.4* 1.2   ------------------------------------------------------------------------------------------------------------------  Cardiac Enzymes No results for input(s): TROPONINI in the last 168 hours. ------------------------------------------------------------------------------------------------------------------  RADIOLOGY:  Dg Cholangiogram Operative  Result Date: 05/26/2017 CLINICAL DATA:  71 year old male with cholecystitis EXAM: INTRAOPERATIVE CHOLANGIOGRAM TECHNIQUE: Cholangiographic images from the C-arm fluoroscopic device were submitted for interpretation post-operatively. Please see the procedural report for the amount of contrast and the fluoroscopy time utilized. COMPARISON:  Abdominal ultrasound 05/23/2017 FINDINGS: A cine image obtained at the time of intraoperative cholangiogram during laparoscopic cholecystectomy demonstrates cannulation of the cystic duct remanent and opacification of the biliary tree. No biliary ductal dilatation, stenosis, stricture or  choledocholithiasis. Contrast material passes freely through the ampulla and into the duodenum. IMPRESSION: Negative intraoperative cholangiogram. Electronically Signed   By: Dellis Filbert.D.  On: 05/26/2017 08:31    EKG:   Orders placed or performed in visit on 05/19/07  . EKG 12-Lead    ASSESSMENT AND PLAN:   71 year old male with past medical history significant for hypertension, GERD, BPH admitted for abdominal pain and noted to have acute gallstone pancreatitis.  # Acute gallstone pancreatitis Pancreatitis has resolved. Status post-cholecystectomy. JP drain in place. Discussed with Dr. Burt Knack of surgery. Will change IV Ancef to Keflex. Likely discharge home tomorrow depending on his progress.  # hypokalemia Replaced and now normal  #3 asthma-stable. Continue home inhalers.  #4 GERD-currently on Protonix  #5 hypertension-hold hydrochlorothiazide   All the records are reviewed and case discussed with Care Management/Social Workerr. Management plans discussed with the patient, family and they are in agreement.  CODE STATUS: Full Code  TOTAL TIME TAKING CARE OF THIS PATIENT: 35 minutes.   POSSIBLE D/C IN 1-2 DAYS, DEPENDING ON CLINICAL CONDITION.   Hillary Bow R M.D on 05/27/2017 at 10:55 AM  Between 7am to 6pm - Pager - [phone]  After 6pm go to [email or web address] - password [email or web address] [facility]  Office  [phone]  CC: Primary care physician; [place] Pink, MD

## 2017-05-28 LAB — COMPREHENSIVE METABOLIC PANEL
ALT: 123 U/L — AB (ref 17–63)
AST: 162 U/L — ABNORMAL HIGH (ref 15–41)
Albumin: 2.7 g/dL — ABNORMAL LOW (ref 3.5–5.0)
Alkaline Phosphatase: 145 U/L — ABNORMAL HIGH (ref 38–126)
Anion gap: 7 (ref 5–15)
BUN: 14 mg/dL (ref 6–20)
CHLORIDE: 101 mmol/L (ref 101–111)
CO2: 26 mmol/L (ref 22–32)
CREATININE: 0.82 mg/dL (ref 0.61–1.24)
Calcium: 8.7 mg/dL — ABNORMAL LOW (ref 8.9–10.3)
Glucose, Bld: 106 mg/dL — ABNORMAL HIGH (ref 65–99)
POTASSIUM: 3.5 mmol/L (ref 3.5–5.1)
Sodium: 134 mmol/L — ABNORMAL LOW (ref 135–145)
Total Bilirubin: 1 mg/dL (ref 0.3–1.2)
Total Protein: 6.8 g/dL (ref 6.5–8.1)

## 2017-05-28 LAB — CBC WITH DIFFERENTIAL/PLATELET
Basophils Absolute: 0 10*3/uL (ref 0–0.1)
Basophils Relative: 0 %
EOS PCT: 4 %
Eosinophils Absolute: 0.3 10*3/uL (ref 0–0.7)
HCT: 36.4 % — ABNORMAL LOW (ref 40.0–52.0)
Hemoglobin: 12 g/dL — ABNORMAL LOW (ref 13.0–18.0)
LYMPHS ABS: 1.1 10*3/uL (ref 1.0–3.6)
LYMPHS PCT: 13 %
MCH: 28.8 pg (ref 26.0–34.0)
MCHC: 33.1 g/dL (ref 32.0–36.0)
MCV: 87.2 fL (ref 80.0–100.0)
MONO ABS: 0.7 10*3/uL (ref 0.2–1.0)
Monocytes Relative: 8 %
Neutro Abs: 6.6 10*3/uL — ABNORMAL HIGH (ref 1.4–6.5)
Neutrophils Relative %: 75 %
PLATELETS: 213 10*3/uL (ref 150–440)
RBC: 4.17 MIL/uL — ABNORMAL LOW (ref 4.40–5.90)
RDW: 13 % (ref 11.5–14.5)
WBC: 8.8 10*3/uL (ref 3.8–10.6)

## 2017-05-28 LAB — SURGICAL PATHOLOGY

## 2017-05-28 NOTE — Progress Notes (Addendum)
[facility] at [place] NAME: Anthony Meyers    MR#:  [id number]  DATE OF BIRTH:  June 28, 1946  SUBJECTIVE:  CHIEF COMPLAINT:   Chief Complaint  Patient presents with  . Abdominal Pain   Patient admitted for gallstone pancreatitis. Had laparoscopic cholecystectomy on 05/26/2017. A JP drain in place. Mild pain at the surgical site. Tolerating clear liquids.  Today abdominal pain is well controlled, no new complaints other than nausea denies any vomiting On IV Ancef.  REVIEW OF SYSTEMS:  Review of Systems  Constitutional: Negative for chills, fever and malaise/fatigue.  HENT: Negative for hearing loss and tinnitus.   Eyes: Negative for blurred vision and double vision.  Respiratory: Negative for cough, shortness of breath and wheezing.   Cardiovascular: Negative for chest pain, palpitations and leg swelling.  Gastrointestinal: Positive for abdominal pain and nausea. Negative for constipation, diarrhea and vomiting.  Genitourinary: Negative for dysuria.  Musculoskeletal: Negative for myalgias.  Neurological: Negative for dizziness, sensory change, speech change, focal weakness, seizures and headaches.  Psychiatric/Behavioral: Negative for depression.    DRUG ALLERGIES:  No Known Allergies  VITALS:  Blood pressure 134/64, pulse 83, temperature 98.5 F (36.9 C), temperature source Oral, resp. rate 18, height 5\' 9"  (1.753 m), weight 83.6 kg (184 lb 3.2 oz), SpO2 97 %.  PHYSICAL EXAMINATION:  Physical Exam  GENERAL:  71 y.o.-year-old patient lying in the bed with no acute distress.  EYES: Pupils equal, round, reactive to light and accommodation. No scleral icterus. Extraocular muscles intact.  HEENT: Head atraumatic, normocephalic. Oropharynx and nasopharynx clear.  NECK:  Supple, no jugular venous distention. No thyroid enlargement, no tenderness.  LUNGS: Normal breath sounds bilaterally, no wheezing, rales,rhonchi or crepitation. No use  of accessory muscles of respiration.  CARDIOVASCULAR: S1, S2 normal. No murmurs, rubs, or gallops.  ABDOMEN: Soft, tender, nondistended. Bowel sounds present. JP Drainage tube from RUQ present. EXTREMITIES: No pedal edema, cyanosis, or clubbing.  NEUROLOGIC: Cranial nerves II through XII are intact. Muscle strength 5/5 in all extremities. Sensation intact. Gait not checked.  PSYCHIATRIC: The patient is alert and oriented x 3.  SKIN: No obvious rash, lesion, or ulcer.    LABORATORY PANEL:   CBC  Recent Labs Lab 05/28/17 0443  WBC 8.8  HGB 12.0*  HCT 36.4*  PLT 213   ------------------------------------------------------------------------------------------------------------------  Chemistries   Recent Labs Lab 05/26/17 0438  05/28/17 0443  NA 138  < > 134*  K 3.4*  < > 3.5  CL 104  < > 101  CO2 26  < > 26  GLUCOSE 117*  < > 106*  BUN 12  < > 14  CREATININE 0.92  < > 0.82  CALCIUM 8.8*  < > 8.7*  MG 2.1  --   --   AST 25  < > 162*  ALT 21  < > 123*  ALKPHOS 56  < > 145*  BILITOT 1.4*  < > 1.0  < > = values in this interval not displayed. ------------------------------------------------------------------------------------------------------------------  Cardiac Enzymes No results for input(s): TROPONINI in the last 168 hours. ------------------------------------------------------------------------------------------------------------------  RADIOLOGY:  No results found.  EKG:   Orders placed or performed in visit on 05/19/07  . EKG 12-Lead    ASSESSMENT AND PLAN:   71 year old male with past medical history significant for hypertension, GERD, BPH admitted for abdominal pain and noted to have acute gallstone pancreatitis.  # Acute gallstone pancreatitis Pancreatitis has resolved. Status post-cholecystectomy. JP drain in  place.  Follow-up with Dr. Burt Knack, LFTs are elevated, recommended to monitor the patient closely and advance to soft diet changed IV Ancef to  Keflex 05/27/17 Likely discharge home tomorrow depending on his progress.  # hypokalemia Replaced and now normal  #3 asthma-stable. Continue home inhalers.  #4 GERD-currently on Protonix  #5 hypertension-hold hydrochlorothiazide   All the records are reviewed and case discussed with Care Management/Social Workerr. Management plans discussed with the patient, family and they are in agreement.  CODE STATUS: Full Code  TOTAL TIME TAKING CARE OF THIS PATIENT: 35 minutes.   POSSIBLE D/C IN 1-2 DAYS, DEPENDING ON CLINICAL CONDITION.   Nicholes Mango M.D on 05/28/2017 at 11:50 AM  Between 7am to 6pm - Pager - [phone]  After 6pm go to [email or web address] - password [email or web address] [facility]  Office  [phone]  CC: Primary care physician; [place] Pink, MD

## 2017-05-28 NOTE — Progress Notes (Signed)
2 Days Post-Op  Subjective: Status post laparoscopic cholecystectomy for empyema of the gallbladder with acute cholecystitis. Patient still has poor appetite and occasional pains. He's had some nausea and is not eating much but thinks he might feel better if he tried something more substantial than the liquids.  Objective: Vital signs in last 24 hours: Temp:  [98.5 F (36.9 C)-99.5 F (37.5 C)] 98.5 F (36.9 C) (06/21 2725) Pulse Rate:  [76-90] 83 (06/21 0638) Resp:  [16-22] 18 (06/21 3664) BP: (126-145)/(59-77) 134/64 (06/21 0638) SpO2:  [94 %-97 %] 97 % (06/21 0638) Last BM Date: 05/23/17  Intake/Output from previous day: 06/20 0701 - 06/21 0700 In: 1520 [P.O.:1520] Out: 557 [Urine:550; Drains:7] Intake/Output this shift: No intake/output data recorded.  Physical exam:  Vital signs are stable and afebrile abdomen is soft nondistended nontender there is some purulent drainage around the drain itself but the drain output in the canister is serosanguineous only without purulence. No jaundice no icterus.  Lab Results: CBC   Recent Labs  05/27/17 0429 05/28/17 0443  WBC 9.3 8.8  HGB 12.4* 12.0*  HCT 36.7* 36.4*  PLT 183 213   BMET  Recent Labs  05/27/17 0429 05/28/17 0443  NA 133* 134*  K 3.7 3.5  CL 101 101  CO2 24 26  GLUCOSE 122* 106*  BUN 12 14  CREATININE 0.64 0.82  CALCIUM 8.6* 8.7*   PT/INR No results for input(s): LABPROT, INR in the last 72 hours. ABG No results for input(s): PHART, HCO3 in the last 72 hours.  Invalid input(s): PCO2, PO2  Studies/Results: Dg Cholangiogram Operative  Result Date: 05/26/2017 CLINICAL DATA:  71 year old male with cholecystitis EXAM: INTRAOPERATIVE CHOLANGIOGRAM TECHNIQUE: Cholangiographic images from the C-arm fluoroscopic device were submitted for interpretation post-operatively. Please see the procedural report for the amount of contrast and the fluoroscopy time utilized. COMPARISON:  Abdominal ultrasound  05/23/2017 FINDINGS: A cine image obtained at the time of intraoperative cholangiogram during laparoscopic cholecystectomy demonstrates cannulation of the cystic duct remanent and opacification of the biliary tree. No biliary ductal dilatation, stenosis, stricture or choledocholithiasis. Contrast material passes freely through the ampulla and into the duodenum. IMPRESSION: Negative intraoperative cholangiogram. Electronically Signed   By: Jacqulynn Cadet M.D.   On: 05/26/2017 08:31    Anti-infectives: Anti-infectives    Start     Dose/Rate Route Frequency Ordered Stop   05/27/17 1400  cephALEXin (KEFLEX) capsule 500 mg     500 mg Oral Every 8 hours 05/27/17 1052     05/26/17 1200  ceFAZolin (ANCEF) IVPB 1 g/50 mL premix  Status:  Discontinued     1 g 100 mL/hr over 30 Minutes Intravenous Every 6 hours 05/26/17 0927 05/27/17 1055      Assessment/Plan: s/p Procedure(s): LAPAROSCOPIC CHOLECYSTECTOMY WITH INTRAOPERATIVE CHOLANGIOGRAM   LFTs remained elevated in spite of a normal cholangiogram. We will advance diet today and continue to observe and continue IV antibiotics for another 24 hours.  Florene Glen, MD, FACS  05/28/2017

## 2017-05-28 NOTE — Care Management Important Message (Signed)
Important Message  Patient Details  Name: Anthony Meyers MRN: [id number] Date of Birth: Apr 22, 1946   Medicare Important Message Given:  Yes    Beverly Sessions, RN 05/28/2017, 2:36 PM

## 2017-05-29 LAB — COMPREHENSIVE METABOLIC PANEL
ALBUMIN: 2.7 g/dL — AB (ref 3.5–5.0)
ALK PHOS: 196 U/L — AB (ref 38–126)
ALT: 122 U/L — AB (ref 17–63)
AST: 99 U/L — ABNORMAL HIGH (ref 15–41)
Anion gap: 7 (ref 5–15)
BUN: 12 mg/dL (ref 6–20)
CALCIUM: 8.9 mg/dL (ref 8.9–10.3)
CHLORIDE: 99 mmol/L — AB (ref 101–111)
CO2: 29 mmol/L (ref 22–32)
CREATININE: 0.84 mg/dL (ref 0.61–1.24)
GFR calc non Af Amer: >60 mL/min (ref >60)
GLUCOSE: 111 mg/dL — AB (ref 65–99)
Potassium: 3.6 mmol/L (ref 3.5–5.1)
SODIUM: 135 mmol/L (ref 135–145)
Total Bilirubin: 0.8 mg/dL (ref 0.3–1.2)
Total Protein: 6.9 g/dL (ref 6.5–8.1)

## 2017-05-29 LAB — CBC WITH DIFFERENTIAL/PLATELET
BASOS ABS: 0 10*3/uL (ref 0–0.1)
BASOS PCT: 0 %
EOS PCT: 4 %
Eosinophils Absolute: 0.3 10*3/uL (ref 0–0.7)
HCT: 36.4 % — ABNORMAL LOW (ref 40.0–52.0)
HEMOGLOBIN: 12.3 g/dL — AB (ref 13.0–18.0)
LYMPHS ABS: 1 10*3/uL (ref 1.0–3.6)
Lymphocytes Relative: 11 %
MCH: 30 pg (ref 26.0–34.0)
MCHC: 33.7 g/dL (ref 32.0–36.0)
MCV: 88.8 fL (ref 80.0–100.0)
Monocytes Absolute: 0.8 10*3/uL (ref 0.2–1.0)
Monocytes Relative: 9 %
NEUTROS PCT: 76 %
Neutro Abs: 6.7 10*3/uL — ABNORMAL HIGH (ref 1.4–6.5)
PLATELETS: 226 10*3/uL (ref 150–440)
RBC: 4.1 MIL/uL — AB (ref 4.40–5.90)
RDW: 13.5 % (ref 11.5–14.5)
WBC: 8.9 10*3/uL (ref 3.8–10.6)

## 2017-05-29 LAB — LIPASE, BLOOD: Lipase: 51 U/L (ref 11–51)

## 2017-05-29 MED ORDER — OMEPRAZOLE 20 MG PO CPDR: 20.0000 mg | DELAYED_RELEASE_CAPSULE | Freq: Every day | ORAL | 1 refills | Status: DC

## 2017-05-29 MED ORDER — CEPHALEXIN 500 MG PO CAPS: 500.0000 mg | ORAL_CAPSULE | Freq: Three times a day (TID) | ORAL | 0 refills | Status: DC

## 2017-05-29 MED ORDER — OXYCODONE HCL 5 MG PO TABS: 5.0000 mg | ORAL_TABLET | Freq: Four times a day (QID) | ORAL | 0 refills | Status: DC | PRN

## 2017-05-29 NOTE — Discharge Instructions (Signed)
Follow-up with primary care physician in a week or sooner as needed Follow-up with surgery Dr. Burt Knack in a week

## 2017-05-29 NOTE — Discharge Summary (Signed)
[facility] NAME: Anthony Meyers    MR#:  [id number]  DATE OF BIRTH:  11/12/1946  DATE OF ADMISSION:  05/23/2017 ADMITTING PHYSICIAN: Lance Coon, MD  DATE OF DISCHARGE: 05/29/17 PRIMARY CARE PHYSICIAN: Maryland Pink, MD    ADMISSION DIAGNOSIS:  Upper abdominal pain [R10.10] Acute biliary pancreatitis, unspecified complication status [F81.82]  DISCHARGE DIAGNOSIS:  Acute gallstone pancreatitis  SECONDARY DIAGNOSIS:   Past Medical History:  Diagnosis Date  . Asthma   . BPH (benign prostatic hypertrophy)   . Brain aneurysm   . Chronic prostatitis   . Elevated PSA   . GERD (gastroesophageal reflux disease)   . HTN (hypertension)     HOSPITAL COURSE:  HPI  Anthony Meyers  is a 71 y.o. male who presents with Acute abdominal pain. Patient states that he started having periumbilical abdominal pain in the morning, by afternoon decided to come to the ED for evaluation. He was found here to have acute pancreatitis. Imaging also reveals sludge in his gallbladder. Hospitalists were called for admission  # Acute gallstone pancreatitis Pancreatitis has resolved. Status post-cholecystectomy. JP drain Removed today Patient is tolerating soft diet Okay to discharge the patient from surgery standpoint, follow-up with surgery Dr. Burt Knack in 1 week   LFTs are still elevated , but clinically patient is stable , please repeat LFTs during the follow-up visit  changed IV Ancef to Keflex 05/27/17, continue for 1 more week and then discontinue Pain management as needed with Oxycodone   # hypokalemia Replaced and now normal  # asthma-stable. Continue home inhalers.  # GERD-currently on Protonix  # hypertension-hydrochlorothiazide      DISCHARGE CONDITIONS:   STABLE  CONSULTS OBTAINED:  Treatment Team:  Clayburn Pert, MD   PROCEDURES   DRUG ALLERGIES:  No Known Allergies  DISCHARGE MEDICATIONS:   Current  Discharge Medication List    START taking these medications   Details  cephALEXin (KEFLEX) 500 MG capsule Take 1 capsule (500 mg total) by mouth every 8 (eight) hours. Qty: 21 capsule, Refills: 0    omeprazole (PRILOSEC) 20 MG capsule Take 1 capsule (20 mg total) by mouth daily. Qty: 30 capsule, Refills: 1    oxyCODONE (OXY IR/ROXICODONE) 5 MG immediate release tablet Take 1 tablet (5 mg total) by mouth every 6 (six) hours as needed for moderate pain, severe pain or breakthrough pain. Qty: 30 tablet, Refills: 0      CONTINUE these medications which have NOT CHANGED   Details  aspirin EC 81 MG tablet Take 81 mg by mouth daily.    budesonide-formoterol (SYMBICORT) 160-4.5 MCG/ACT inhaler Inhale 2 puffs into the lungs 2 (two) times daily.    hydrochlorothiazide (HYDRODIURIL) 25 MG tablet Take 25 mg by mouth daily.    potassium chloride SA (K-DUR,KLOR-CON) 20 MEQ tablet Take 20 mEq by mouth 2 (two) times daily.     tiotropium (SPIRIVA) 18 MCG inhalation capsule Place 18 mcg into inhaler and inhale daily.    Umeclidinium Bromide (INCRUSE ELLIPTA) 62.5 MCG/INH AEPB INHALE 1 PUFFS BY MOUTH ONCE DAILY      STOP taking these medications     esomeprazole (NEXIUM) 40 MG capsule          DISCHARGE INSTRUCTIONS:   Follow-up with primary care physician in a week or sooner as needed Follow-up with surgery Dr. Burt Knack in a week   DIET:  Cardiac diet-SOFT  DISCHARGE CONDITION:  Fair  ACTIVITY:  Activity as tolerated  OXYGEN:  Home Oxygen: No.   Oxygen Delivery: room air  DISCHARGE LOCATION:  home   If you experience worsening of your admission symptoms, develop shortness of breath, life threatening emergency, suicidal or homicidal thoughts you must seek medical attention immediately by calling 911 or calling your MD immediately  if symptoms less severe.  You Must read complete instructions/literature along with all the possible adverse reactions/side effects for all the  Medicines you take and that have been prescribed to you. Take any new Medicines after you have completely understood and accpet all the possible adverse reactions/side effects.   Please note  You were cared for by a hospitalist during your hospital stay. If you have any questions about your discharge medications or the care you received while you were in the hospital after you are discharged, you can call the unit and asked to speak with the hospitalist on call if the hospitalist that took care of you is not available. Once you are discharged, your primary care physician will handle any further medical issues. Please note that NO REFILLS for any discharge medications will be authorized once you are discharged, as it is imperative that you return to your primary care physician (or establish a relationship with a primary care physician if you do not have one) for your aftercare needs so that they can reassess your need for medications and monitor your lab values.     Today  Chief Complaint  Patient presents with  . Abdominal Pain    Patient is tolerating soft diet, abdominal pain is manageable and wants to go home, okay to discharge patient from surgery standpoint, JP drain removed  ROS:  CONSTITUTIONAL: Denies fevers, chills. Denies any fatigue, weakness.  EYES: Denies blurry vision, double vision, eye pain. EARS, NOSE, THROAT: Denies tinnitus, ear pain, hearing loss. RESPIRATORY: Denies cough, wheeze, shortness of breath.  CARDIOVASCULAR: Denies chest pain, palpitations, edema.  GASTROINTESTINAL: Denies nausea, vomiting, diarrhea, reports minimal abdominal pain with movements but manageable. Denies bright red blood per rectum. GENITOURINARY: Denies dysuria, hematuria. ENDOCRINE: Denies nocturia or thyroid problems. HEMATOLOGIC AND LYMPHATIC: Denies easy bruising or bleeding. SKIN: Denies rash or lesion. MUSCULOSKELETAL: Denies pain in neck, back, shoulder, knees, hips or arthritic  symptoms.  NEUROLOGIC: Denies paralysis, paresthesias.  PSYCHIATRIC: Denies anxiety or depressive symptoms.   VITAL SIGNS:  Blood pressure 137/73, pulse 84, temperature 98.4 F (36.9 C), temperature source Oral, resp. rate 19, height 5\' 9"  (1.753 m), weight 83.6 kg (184 lb 3.2 oz), SpO2 94 %.  I/O:    Intake/Output Summary (Last 24 hours) at 05/29/17 1214 Last data filed at 05/29/17 0400  Gross per 24 hour  Intake              360 ml  Output              211 ml  Net              149 ml    PHYSICAL EXAMINATION:  GENERAL:  71 y.o.-year-old patient lying in the bed with no acute distress.  EYES: Pupils equal, round, reactive to light and accommodation. No scleral icterus. Extraocular muscles intact.  HEENT: Head atraumatic, normocephalic. Oropharynx and nasopharynx clear.  NECK:  Supple, no jugular venous distention. No thyroid enlargement, no tenderness.  LUNGS: Normal breath sounds bilaterally, no wheezing, rales,rhonchi or crepitation. No use of accessory muscles of respiration.  CARDIOVASCULAR: S1, S2 normal. No murmurs, rubs, or gallops.  ABDOMEN: Soft, minimal and redness in the surgical  area, non-distended. Bowel sounds present. EXTREMITIES: No pedal edema, cyanosis, or clubbing.  NEUROLOGIC: Cranial nerves II through XII are intact. Muscle strength 5/5 in all extremities. Sensation intact. Gait not checked.  PSYCHIATRIC: The patient is alert and oriented x 3.  SKIN: No obvious rash, lesion, or ulcer.   DATA REVIEW:   CBC  Recent Labs Lab 05/29/17 0515  WBC 8.9  HGB 12.3*  HCT 36.4*  PLT 226    Chemistries   Recent Labs Lab 05/26/17 0438  05/29/17 0515  NA 138  < > 135  K 3.4*  < > 3.6  CL 104  < > 99*  CO2 26  < > 29  GLUCOSE 117*  < > 111*  BUN 12  < > 12  CREATININE 0.92  < > 0.84  CALCIUM 8.8*  < > 8.9  MG 2.1  --   --   AST 25  < > 99*  ALT 21  < > 122*  ALKPHOS 56  < > 196*  BILITOT 1.4*  < > 0.8  < > = values in this interval not  displayed.  Cardiac Enzymes No results for input(s): TROPONINI in the last 168 hours.  Microbiology Results  Results for orders placed or performed during the hospital encounter of 05/23/17  MRSA PCR Screening     Status: None   Collection Time: 05/26/17  5:41 AM  Result Value Ref Range Status   MRSA by PCR NEGATIVE NEGATIVE Final    Comment:        The GeneXpert MRSA Assay (FDA approved for NASAL specimens only), is one component of a comprehensive MRSA colonization surveillance program. It is not intended to diagnose MRSA infection nor to guide or monitor treatment for MRSA infections.     RADIOLOGY:  Dg Cholangiogram Operative  Result Date: 05/26/2017 CLINICAL DATA:  71 year old male with cholecystitis EXAM: INTRAOPERATIVE CHOLANGIOGRAM TECHNIQUE: Cholangiographic images from the C-arm fluoroscopic device were submitted for interpretation post-operatively. Please see the procedural report for the amount of contrast and the fluoroscopy time utilized. COMPARISON:  Abdominal ultrasound 05/23/2017 FINDINGS: A cine image obtained at the time of intraoperative cholangiogram during laparoscopic cholecystectomy demonstrates cannulation of the cystic duct remanent and opacification of the biliary tree. No biliary ductal dilatation, stenosis, stricture or choledocholithiasis. Contrast material passes freely through the ampulla and into the duodenum. IMPRESSION: Negative intraoperative cholangiogram. Electronically Signed   By: Jacqulynn Cadet M.D.   On: 05/26/2017 08:31    EKG:   Orders placed or performed in visit on 05/19/07  . EKG 12-Lead      Management plans discussed with the patient, family and they are in agreement.  CODE STATUS:     Code Status Orders        Start     Ordered   05/24/17 0140  Full code  Continuous     05/24/17 0139    Code Status History    Date Active Date Inactive Code Status Order ID Comments User Context   This patient has a current code  status but no historical code status.      TOTAL TIME TAKING CARE OF THIS PATIENT: 45 minutes.   Note: This dictation was prepared with Dragon dictation along with smaller phrase technology. Any transcriptional errors that result from this process are unintentional.   @[facility] @  on 05/29/2017 at 12:14 PM  Between 7am to 6pm - Pager - [phone]  After 6pm go to [email or web address] - password [email or web address] [facility]  Hospitalists  Office  [phone]  CC: Primary care physician; Maryland Pink, MD

## 2017-05-29 NOTE — Progress Notes (Signed)
Patient discharge teaching given, including activity, diet, follow-up appoints, and medications. Patient verbalized understanding of all discharge instructions. IV access was d/c'd. Vitals are stable. Skin is intact except as charted in most recent assessments. Pt to be escorted out by volunteer, to be driven home by family.  Raunak Antuna  

## 2017-05-29 NOTE — Care Management (Signed)
Patient was discharged home today. Patient was weaned to RA.  Ambulated with RN staff.  No RNCM needs identified at time of discharge.

## 2017-05-29 NOTE — Progress Notes (Signed)
3 Days Post-Op  Subjective: Status post laparoscopic cholecystectomy for empyema of the gallbladder. Patient feels well he is not taking any pain medication and is tolerating a regular diet.  Objective: Vital signs in last 24 hours: Temp:  [98.2 F (36.8 C)-99.2 F (37.3 C)] 98.4 F (36.9 C) (06/22 0634) Pulse Rate:  [82-94] 84 (06/22 0634) Resp:  [19-20] 19 (06/22 0634) BP: (133-137)/(72-77) 137/73 (06/22 0634) SpO2:  [91 %-96 %] 94 % (06/22 0634) Last BM Date: 05/28/17  Intake/Output from previous day: 06/21 0701 - 06/22 0700 In: 360 [P.O.:360] Out: 211 [Urine:200; Drains:11] Intake/Output this shift: No intake/output data recorded.  Physical exam:  Awake alert oriented vital signs are stable abdomen soft nontender no bile in drain.  Drain is removed.  Lab Results: CBC   Recent Labs  05/28/17 0443 05/29/17 0515  WBC 8.8 8.9  HGB 12.0* 12.3*  HCT 36.4* 36.4*  PLT 213 226   BMET  Recent Labs  05/28/17 0443 05/29/17 0515  NA 134* 135  K 3.5 3.6  CL 101 99*  CO2 26 29  GLUCOSE 106* 111*  BUN 14 12  CREATININE 0.82 0.84  CALCIUM 8.7* 8.9   PT/INR No results for input(s): LABPROT, INR in the last 72 hours. ABG No results for input(s): PHART, HCO3 in the last 72 hours.  Invalid input(s): PCO2, PO2  Studies/Results: No results found.  Anti-infectives: Anti-infectives    Start     Dose/Rate Route Frequency Ordered Stop   05/27/17 1400  cephALEXin (KEFLEX) capsule 500 mg     500 mg Oral Every 8 hours 05/27/17 1052     05/26/17 1200  ceFAZolin (ANCEF) IVPB 1 g/50 mL premix  Status:  Discontinued     1 g 100 mL/hr over 30 Minutes Intravenous Every 6 hours 05/26/17 0927 05/27/17 1055      Assessment/Plan: s/p Procedure(s): LAPAROSCOPIC CHOLECYSTECTOMY WITH INTRAOPERATIVE CHOLANGIOGRAM   Drain is removed. Patient may go home today on oral antibiotics to follow-up in my office in 10 days. May shower.  Florene Glen, MD, FACS  05/29/2017

## 2017-06-01 ENCOUNTER — Other Ambulatory Visit: Payer: Self-pay

## 2017-06-02 DIAGNOSIS — R7989 Other specified abnormal findings of blood chemistry: Secondary | ICD-10-CM | POA: Diagnosis not present

## 2017-06-05 ENCOUNTER — Encounter: Payer: Self-pay | Admitting: Surgery

## 2017-06-05 ENCOUNTER — Ambulatory Visit (INDEPENDENT_AMBULATORY_CARE_PROVIDER_SITE_OTHER): Payer: Medicare Other | Admitting: Surgery

## 2017-06-05 VITALS — BP 151/73 | HR 79 | Temp 97.6°F | Ht 73.0 in | Wt 177.0 lb

## 2017-06-05 DIAGNOSIS — K810 Acute cholecystitis: Secondary | ICD-10-CM

## 2017-06-05 NOTE — Progress Notes (Signed)
Outpatient postop visit  06/05/2017  Anthony Meyers is an 71 y.o. male.    Procedure: Laparoscopic cholecystectomy with cholangiography  CC: No problems  HPI: This patient underwent a laparoscopic cholecystectomy with cholangiography. He had acute cholecystitis. Glandular gram was negative Patient feels weak as he has not been eating well but is hungry and wants to advance his diet. Interestingly he was told to be on a regular diet when he left the hospital. He has no other problems. Medications reviewed.    Physical Exam:  There were no vitals taken for this visit.    PE: No icterus no jaundice abdomen is soft nontender wounds are clean no erythema no drainage    Assessment/Plan:  Pathology is reviewed. Patient doing very well recommend follow up on an as-needed basis  Florene Glen, MD, FACS

## 2017-08-28 DIAGNOSIS — R748 Abnormal levels of other serum enzymes: Secondary | ICD-10-CM | POA: Diagnosis not present

## 2017-08-28 DIAGNOSIS — Z9049 Acquired absence of other specified parts of digestive tract: Secondary | ICD-10-CM | POA: Diagnosis not present

## 2017-08-28 DIAGNOSIS — R1031 Right lower quadrant pain: Secondary | ICD-10-CM | POA: Diagnosis not present

## 2017-09-15 DIAGNOSIS — Z23 Encounter for immunization: Secondary | ICD-10-CM | POA: Diagnosis not present

## 2017-09-28 ENCOUNTER — Other Ambulatory Visit: Payer: Medicare Other

## 2017-09-28 DIAGNOSIS — R972 Elevated prostate specific antigen [PSA]: Secondary | ICD-10-CM

## 2017-09-28 NOTE — Progress Notes (Signed)
9:50 AM   Anthony Meyers [birth year] [id number]  Referring provider: Maryland Pink, MD [address] [facility] [place], [place] [address]  Chief Complaint  Patient presents with  . Benign Prostatic Hypertrophy    6 month follow up  . Elevated PSA    HPI: Patient is a 71 year old African-American male with a history of elevated PSA and BPH with LUTS presenting today for his biannual follow-up appointment.    History of elevated PSA Patient underwent a prostate biopsy in 2014 for a PSA of 4.5.  Prostate biopsy was negative.  His brother has a history of prostate cancer.  His most recent PSA was 4.8 ng/mL on 09/2017.      BPH WITH LUTS His IPSS score today is 2, which is mild lower urinary tract symptomatology.  He is mostly satisfied with his quality life due to his urinary symptoms.   His previous IPSS score was 4/1.  He has no urinary complaints at this time.  He denies any dysuria, hematuria or suprapubic pain.   He also denies any recent fevers, chills, nausea or vomiting.      IPSS    Row Name 09/30/17 0900         International Prostate Symptom Score   How often have you had the sensation of not emptying your bladder? Not at All     How often have you had to urinate less than every two hours? Not at All     How often have you found you stopped and started again several times when you urinated? Not at All     How often have you found it difficult to postpone urination? Not at All     How often have you had a weak urinary stream? Not at All     How often have you had to strain to start urination? Not at All     How many times did you typically get up at night to urinate? 2 Times     Total IPSS Score 2       Quality of Life due to urinary symptoms   If you were to spend the rest of your life with your urinary condition just the way it is now how would you feel about that? Mostly Satisfied        Score:  1-7 Mild 8-19 Moderate 20-35 Severe   PMH: Past  Medical History:  Diagnosis Date  . Asthma   . BPH (benign prostatic hypertrophy)   . Brain aneurysm   . Chronic prostatitis   . Elevated PSA   . GERD (gastroesophageal reflux disease)   . HTN (hypertension)     Surgical History: Past Surgical History:  Procedure Laterality Date  . BRAIN SURGERY    . CHOLECYSTECTOMY N/A 05/26/2017   Procedure: LAPAROSCOPIC CHOLECYSTECTOMY WITH INTRAOPERATIVE CHOLANGIOGRAM;  Surgeon: Florene Glen, MD;  Location: [facility] ORS;  Service: General;  Laterality: N/A;  . COLONOSCOPY WITH PROPOFOL N/A 12/04/2015   Procedure: COLONOSCOPY WITH PROPOFOL;  Surgeon: Lollie Sails, MD;  Location: [facility] ENDOSCOPY;  Service: Endoscopy;  Laterality: N/A;  . LUNG SURGERY Left     Home Medications:  Allergies as of 09/30/2017   No Known Allergies     Medication List       Accurate as of 09/30/17  9:50 AM. Always use your most recent med list.          aspirin EC 81 MG tablet Take 81 mg by mouth daily.  budesonide-formoterol 160-4.5 MCG/ACT inhaler Commonly known as:  SYMBICORT Inhale 2 puffs into the lungs 2 (two) times daily.   cephALEXin 500 MG capsule Commonly known as:  KEFLEX Take 1 capsule (500 mg total) by mouth every 8 (eight) hours.   hydrochlorothiazide 25 MG tablet Commonly known as:  HYDRODIURIL Take 25 mg by mouth daily.   INCRUSE ELLIPTA 62.5 MCG/INH Aepb Generic drug:  umeclidinium bromide INHALE 1 PUFFS BY MOUTH ONCE DAILY   omeprazole 20 MG capsule Commonly known as:  PRILOSEC Take 1 capsule (20 mg total) by mouth daily.   oxyCODONE 5 MG immediate release tablet Commonly known as:  Oxy IR/ROXICODONE Take by mouth.   potassium chloride SA 20 MEQ tablet Commonly known as:  K-DUR,KLOR-CON Take 20 mEq by mouth 2 (two) times daily.   tiotropium 18 MCG inhalation capsule Commonly known as:  SPIRIVA Place 18 mcg into inhaler and inhale daily.       Allergies: No Known Allergies  Family History: Family History    Problem Relation Age of Onset  . Throat cancer Brother   . Stroke Father   . Diabetes Mellitus II Father   . Hypertension Father   . Prostate cancer Brother   . Kidney disease Neg Hx   . Kidney cancer Neg Hx   . Bladder Cancer Neg Hx     Social History:  reports that he quit smoking about 22 years ago. He has never used smokeless tobacco. He reports that he does not drink alcohol or use drugs.  ROS: UROLOGY Frequent Urination?: No Hard to postpone urination?: No Burning/pain with urination?: No Get up at night to urinate?: No Leakage of urine?: No Urine stream starts and stops?: No Trouble starting stream?: No Do you have to strain to urinate?: No Blood in urine?: No Urinary tract infection?: No Sexually transmitted disease?: No Injury to kidneys or bladder?: No Painful intercourse?: No Weak stream?: No Erection problems?: No Penile pain?: No  Gastrointestinal Nausea?: No Vomiting?: No Indigestion/heartburn?: No Diarrhea?: No Constipation?: No  Constitutional Fever: No Night sweats?: No Weight loss?: Yes Fatigue?: No  Skin Skin rash/lesions?: No Itching?: Yes  Eyes Blurred vision?: No Double vision?: No  Ears/Nose/Throat Sore throat?: No Sinus problems?: Yes  Hematologic/Lymphatic Swollen glands?: No Easy bruising?: No  Cardiovascular Leg swelling?: No Chest pain?: No  Respiratory Cough?: Yes Shortness of breath?: No  Endocrine Excessive thirst?: No  Musculoskeletal Back pain?: No Joint pain?: Yes  Neurological Headaches?: No Dizziness?: No  Psychologic Depression?: No Anxiety?: No  Physical Exam: BP 136/78   Pulse (!) 59   Ht 5\' 9"  (1.753 m)   Wt 179 lb 11.2 oz (81.5 kg)   BMI 26.54 kg/m   GU: No CVA tenderness.  No bladder fullness or masses.  Patient with uncircumcised phallus. Foreskin easily retracted  Urethral meatus is patent.  No penile discharge. No penile lesions or rashes. Scrotum without lesions, cysts, rashes  and/or edema.  Testicles are located scrotally bilaterally. No masses are appreciated in the testicles. Left and right epididymis are normal.  Right hydrocele was noted.   Rectal: Patient with  normal sphincter tone. Anus and perineum without scarring or rashes. No rectal masses are appreciated. Prostate is approximately 50 grams, no nodules are appreciated. Seminal vesicles are normal.   Laboratory Data: PSA History  4.15 ng/mL on 07/20/2013  Negative prostate biopsy  7.4 ng/mL on 09/05/2014  5.3 ng/mL on 09/13/2015  4K was 2 % on 09/13/2015  6.1 ng/mL on 03/13/2016  PSA, Free  0.73 - 35% probability of prostate cancer  8.2 ng/mL on 09/17/2016  6.0 ng/mL on 03/11/2017  4.8 ng/mL on 09/2017  Assessment & Plan:    1. Elevated PSA  - see trend above  - current PSA 4.8  - RTC in 6 months for exam and PSA  2. BPH with LUTS  - IPSS score is 2/2, it is stable  - Continue conservative management, avoiding bladder irritants and timed voiding's  - RTC in 6 months for IPSS, PSA and exam   Return in about 6 months (around 03/31/2018) for IPSS, PSA and exam.  Zara Council, [facility]  [place] [address], [place], [place] [address] [phone]

## 2017-09-29 LAB — PSA: PROSTATE SPECIFIC AG, SERUM: 4.8 ng/mL — AB (ref 0.0–4.0)

## 2017-09-30 ENCOUNTER — Encounter: Payer: Self-pay | Admitting: Urology

## 2017-09-30 ENCOUNTER — Ambulatory Visit (INDEPENDENT_AMBULATORY_CARE_PROVIDER_SITE_OTHER): Payer: Medicare Other | Admitting: Urology

## 2017-09-30 VITALS — BP 136/78 | HR 59 | Ht 69.0 in | Wt 179.7 lb

## 2017-09-30 DIAGNOSIS — N401 Enlarged prostate with lower urinary tract symptoms: Secondary | ICD-10-CM | POA: Diagnosis not present

## 2017-09-30 DIAGNOSIS — R972 Elevated prostate specific antigen [PSA]: Secondary | ICD-10-CM

## 2017-09-30 DIAGNOSIS — N138 Other obstructive and reflux uropathy: Secondary | ICD-10-CM | POA: Diagnosis not present

## 2018-03-25 ENCOUNTER — Other Ambulatory Visit: Payer: Medicare Other

## 2018-03-25 DIAGNOSIS — R972 Elevated prostate specific antigen [PSA]: Secondary | ICD-10-CM | POA: Diagnosis not present

## 2018-03-26 LAB — PSA: Prostate Specific Ag, Serum: 4.8 ng/mL — ABNORMAL HIGH (ref 0.0–4.0)

## 2018-03-31 ENCOUNTER — Ambulatory Visit: Payer: Medicare Other | Admitting: Urology

## 2018-04-19 NOTE — Progress Notes (Signed)
10:41 AM   Saabir Blyth 05/16/1946 [id number]  Referring provider: [place] Pink, MD [address] [facility] [place], [place] [address]  Chief Complaint  Patient presents with  . Follow-up  . Elevated PSA    HPI: Patient is a 72 year old African-American male with a history of elevated PSA and BPH with LUTS presenting today for his biannual follow-up appointment.    History of elevated PSA Patient underwent a prostate biopsy in 2014 for a PSA of 4.5.  Prostate biopsy was negative.  His brother has a history of prostate cancer.  His most recent PSA was 4.8 ng/mL on 03/2018      BPH WITH LUTS His IPSS score today is 6, which is mild lower urinary tract symptomatology.  He is mostly satisfied with his quality life due to his urinary symptoms.   His previous IPSS score was 2/2.  He is complaining of nocturia x2.  He denies any dysuria, hematuria or suprapubic pain.   He also denies any recent fevers, chills, nausea or vomiting.  He states he drinks a lot of regular Coke.   IPSS    Row Name 04/21/18 1000         International Prostate Symptom Score   How often have you had the sensation of not emptying your bladder?  Less than half the time     How often have you had to urinate less than every two hours?  Less than half the time     How often have you found you stopped and started again several times when you urinated?  Not at All     How often have you found it difficult to postpone urination?  Not at All     How often have you had a weak urinary stream?  Not at All     How often have you had to strain to start urination?  Not at All     How many times did you typically get up at night to urinate?  2 Times     Total IPSS Score  6       Quality of Life due to urinary symptoms   If you were to spend the rest of your life with your urinary condition just the way it is now how would you feel about that?  Mostly Satisfied        Score:  1-7 Mild 8-19 Moderate 20-35  Severe   PMH: Past Medical History:  Diagnosis Date  . Asthma   . BPH (benign prostatic hypertrophy)   . Brain aneurysm   . Chronic prostatitis   . Elevated PSA   . GERD (gastroesophageal reflux disease)   . HTN (hypertension)     Surgical History: Past Surgical History:  Procedure Laterality Date  . BRAIN SURGERY    . CHOLECYSTECTOMY N/A 05/26/2017   Procedure: LAPAROSCOPIC CHOLECYSTECTOMY WITH INTRAOPERATIVE CHOLANGIOGRAM;  Surgeon: Florene Glen, MD;  Location: [facility] ORS;  Service: General;  Laterality: N/A;  . COLONOSCOPY WITH PROPOFOL N/A 12/04/2015   Procedure: COLONOSCOPY WITH PROPOFOL;  Surgeon: Lollie Sails, MD;  Location: [facility] ENDOSCOPY;  Service: Endoscopy;  Laterality: N/A;  . LUNG SURGERY Left     Home Medications:  Allergies as of 04/21/2018   No Known Allergies     Medication List        Accurate as of 04/21/18 10:41 AM. Always use your most recent med list.          aspirin EC  81 MG tablet Take 81 mg by mouth daily.   budesonide-formoterol 160-4.5 MCG/ACT inhaler Commonly known as:  SYMBICORT Inhale 2 puffs into the lungs 2 (two) times daily.   hydrochlorothiazide 25 MG tablet Commonly known as:  HYDRODIURIL Take 25 mg by mouth daily.   INCRUSE ELLIPTA 62.5 MCG/INH Aepb Generic drug:  umeclidinium bromide INHALE 1 PUFFS BY MOUTH ONCE DAILY   omeprazole 20 MG capsule Commonly known as:  PRILOSEC Take 1 capsule (20 mg total) by mouth daily.   potassium chloride SA 20 MEQ tablet Commonly known as:  K-DUR,KLOR-CON Take 20 mEq by mouth 2 (two) times daily.   tiotropium 18 MCG inhalation capsule Commonly known as:  SPIRIVA Place 18 mcg into inhaler and inhale daily.       Allergies: No Known Allergies  Family History: Family History  Problem Relation Age of Onset  . Throat cancer Brother   . Stroke Father   . Diabetes Mellitus II Father   . Hypertension Father   . Prostate cancer Brother   . Kidney disease Neg Hx   .  Kidney cancer Neg Hx   . Bladder Cancer Neg Hx     Social History:  reports that he quit smoking about 22 years ago. He has never used smokeless tobacco. He reports that he does not drink alcohol or use drugs.  ROS: UROLOGY Frequent Urination?: No Hard to postpone urination?: No Burning/pain with urination?: No Get up at night to urinate?: Yes Leakage of urine?: No Urine stream starts and stops?: No Trouble starting stream?: No Do you have to strain to urinate?: No Blood in urine?: No Urinary tract infection?: No Sexually transmitted disease?: No Injury to kidneys or bladder?: No Painful intercourse?: No Weak stream?: No Erection problems?: No Penile pain?: No  Gastrointestinal Nausea?: No Vomiting?: No Indigestion/heartburn?: No Diarrhea?: No Constipation?: No  Constitutional Fever: No Night sweats?: No Weight loss?: Yes Fatigue?: No  Skin Skin rash/lesions?: No Itching?: Yes  Eyes Blurred vision?: No Double vision?: No  Ears/Nose/Throat Sore throat?: No Sinus problems?: No  Hematologic/Lymphatic Swollen glands?: No Easy bruising?: No  Cardiovascular Leg swelling?: No Chest pain?: No  Respiratory Cough?: Yes Shortness of breath?: No  Endocrine Excessive thirst?: No  Musculoskeletal Back pain?: No Joint pain?: Yes  Neurological Headaches?: No Dizziness?: No  Psychologic Depression?: No Anxiety?: No  Physical Exam: BP 131/65 (BP Location: Right Arm, Patient Position: Sitting, Cuff Size: Large)   Pulse (!) 59   Ht 5\' 9"  (1.753 m)   Wt 192 lb (87.1 kg)   SpO2 99%   BMI 28.35 kg/m   Constitutional: Well nourished. Alert and oriented, No acute distress. HEENT: [place] AT, moist mucus membranes. Trachea midline, no masses. Cardiovascular: No clubbing, cyanosis, or edema. Respiratory: Normal respiratory effort, no increased work of breathing. GI: Abdomen is soft, non tender, non distended, no abdominal masses. Liver and spleen not  palpable.  No hernias appreciated.  Stool sample for occult testing is not indicated.   GU: No CVA tenderness.  No bladder fullness or masses.  Patient with uncircumcised phallus.  Foreskin easily retracted  Urethral meatus is patent.  No penile discharge. No penile lesions or rashes. Scrotum without lesions, cysts, rashes and/or edema.  Testicles are located scrotally bilaterally. No masses are appreciated in the testicles. Left and right epididymis are normal. Rectal: Patient with  normal sphincter tone. Anus and perineum without scarring or rashes. No rectal masses are appreciated. Prostate is approximately 60 grams, firm, no nodules are  appreciated. Seminal vesicles are normal. Skin: No rashes, bruises or suspicious lesions. Lymph: No cervical or inguinal adenopathy. Neurologic: Grossly intact, no focal deficits, moving all 4 extremities. Psychiatric: Normal mood and affect.    Laboratory Data: PSA History  4.15 ng/mL on 07/20/2013  Negative prostate biopsy  7.4 ng/mL on 09/05/2014  5.3 ng/mL on 09/13/2015  4K was 2 % on 09/13/2015  6.1 ng/mL on 03/13/2016    PSA, Free  0.73 - 35% probability of prostate cancer  8.2 ng/mL on 09/17/2016  6.0 ng/mL on 03/11/2017  4.8 ng/mL on 09/2017  4.8 ng/mL on 03/2018  I have reviewed the labs  Assessment & Plan:    1. Elevated PSA  - see trend above  - current PSA 4.8  - RTC in 3 months for exam and PSA  2. BPH with LUTS  - IPSS score is 6/2, it is slightly worse  - Continue conservative management, avoiding bladder irritants and timed voiding's; more water  - most bothersome symptom is nocturia x 1  - will start finasteride 5 mg daily in an effort to reduce the size of the prostate as patient is worried about it enlarging further; side effects discussed  - RTC in 3 months for IPSS, PSA and exam   3. Abnormal prostate exam Prostate somewhat firmer than prior exam Discussed with the patient that this may represent a prostate cancer and  that I would like him to return in 3 months for a repeat exam Repeat PSA and exam in three months  Return in about 3 months (around 07/22/2018) for IPSS, PSA and exam.  Zara Council, [facility]  [address] [place], [place] [address] [phone]

## 2018-04-21 ENCOUNTER — Encounter: Payer: Self-pay | Admitting: Urology

## 2018-04-21 ENCOUNTER — Ambulatory Visit (INDEPENDENT_AMBULATORY_CARE_PROVIDER_SITE_OTHER): Payer: Medicare Other | Admitting: Urology

## 2018-04-21 VITALS — BP 131/65 | HR 59 | Ht 69.0 in | Wt 192.0 lb

## 2018-04-21 DIAGNOSIS — N138 Other obstructive and reflux uropathy: Secondary | ICD-10-CM

## 2018-04-21 DIAGNOSIS — R972 Elevated prostate specific antigen [PSA]: Secondary | ICD-10-CM

## 2018-04-21 DIAGNOSIS — R3989 Other symptoms and signs involving the genitourinary system: Secondary | ICD-10-CM

## 2018-04-21 DIAGNOSIS — N401 Enlarged prostate with lower urinary tract symptoms: Secondary | ICD-10-CM

## 2018-04-21 MED ORDER — FINASTERIDE 5 MG PO TABS: 5.0000 mg | ORAL_TABLET | Freq: Every day | ORAL | 3 refills | Status: AC

## 2018-07-12 DIAGNOSIS — J449 Chronic obstructive pulmonary disease, unspecified: Secondary | ICD-10-CM | POA: Diagnosis not present

## 2018-07-12 DIAGNOSIS — I10 Essential (primary) hypertension: Secondary | ICD-10-CM | POA: Diagnosis not present

## 2018-07-12 DIAGNOSIS — E876 Hypokalemia: Secondary | ICD-10-CM | POA: Diagnosis not present

## 2018-07-12 DIAGNOSIS — K219 Gastro-esophageal reflux disease without esophagitis: Secondary | ICD-10-CM | POA: Diagnosis not present

## 2018-07-19 ENCOUNTER — Other Ambulatory Visit: Payer: Medicare Other

## 2018-07-19 ENCOUNTER — Other Ambulatory Visit: Payer: Self-pay

## 2018-07-19 DIAGNOSIS — R972 Elevated prostate specific antigen [PSA]: Secondary | ICD-10-CM

## 2018-07-20 LAB — PSA: Prostate Specific Ag, Serum: 5.9 ng/mL — ABNORMAL HIGH (ref 0.0–4.0)

## 2018-07-20 NOTE — Progress Notes (Signed)
9:38 AM   Anthony Meyers [birth year] [id number]  Referring provider: [place] Pink, MD [address] [facility] [place], [place] [address]  Chief Complaint  Patient presents with  . Elevated PSA    HPI: Patient is a 72 year old African-American male with a history of elevated PSA and BPH with LUTS presenting today for a three month follow up.     History of elevated PSA PSA Trend  4.15 in 07/2013   4.5 in 08/2013 neg biopsy  7.4 in 08/2014  4.35 in 08/2015  5.3 in 09/2015  4K score 2% in 09/2015  6.1 in 03/2016  8.2 in 09/17/2016  6.0 in 03/2017  4.8 in 09/2017  4.8 in 03/2018 - started on finasteride, but stopped it due to chest pain   5.9 in 07/2018   His brother has a history of prostate cancer.    BPH WITH LUTS His IPSS score today is 6, which is mild lower urinary tract symptomatology.  He is mostly satisfied with his quality life due to his urinary symptoms.   His previous IPSS score was 6/2.  He is complaining of nocturia x 2.  He denies any dysuria, hematuria or suprapubic pain.   He also denies any recent fevers, chills, nausea or vomiting.  He states he drinks a lot of regular Coke.   IPSS    Row Name 07/21/18 0900         International Prostate Symptom Score   How often have you had the sensation of not emptying your bladder?  Not at All     How often have you had to urinate less than every two hours?  Less than half the time     How often have you found you stopped and started again several times when you urinated?  Not at All     How often have you found it difficult to postpone urination?  Less than 1 in 5 times     How often have you had a weak urinary stream?  Not at All     How often have you had to strain to start urination?  Less than 1 in 5 times     How many times did you typically get up at night to urinate?  2 Times     Total IPSS Score  6       Quality of Life due to urinary symptoms   If you were to spend the rest of your life  with your urinary condition just the way it is now how would you feel about that?  Mostly Satisfied        Score:  1-7 Mild 8-19 Moderate 20-35 Severe   PMH: Past Medical History:  Diagnosis Date  . Asthma   . BPH (benign prostatic hypertrophy)   . Brain aneurysm   . Chronic prostatitis   . Elevated PSA   . GERD (gastroesophageal reflux disease)   . HTN (hypertension)     Surgical History: Past Surgical History:  Procedure Laterality Date  . BRAIN SURGERY    . CHOLECYSTECTOMY N/A 05/26/2017   Procedure: LAPAROSCOPIC CHOLECYSTECTOMY WITH INTRAOPERATIVE CHOLANGIOGRAM;  Surgeon: Florene Glen, MD;  Location: [facility] ORS;  Service: General;  Laterality: N/A;  . COLONOSCOPY WITH PROPOFOL N/A 12/04/2015   Procedure: COLONOSCOPY WITH PROPOFOL;  Surgeon: Lollie Sails, MD;  Location[facility] ENDOSCOPY;  Service: Endoscopy;  Laterality: N/A;  . LUNG SURGERY Left     Home Medications:  Allergies as of  07/21/2018   No Known Allergies     Medication List        Accurate as of 07/21/18  9:38 AM. Always use your most recent med list.          aspirin EC 81 MG tablet Take 81 mg by mouth daily.   budesonide-formoterol 160-4.5 MCG/ACT inhaler Commonly known as:  SYMBICORT Inhale 2 puffs into the lungs 2 (two) times daily.   finasteride 5 MG tablet Commonly known as:  PROSCAR Take 1 tablet (5 mg total) by mouth daily.   hydrochlorothiazide 25 MG tablet Commonly known as:  HYDRODIURIL Take 25 mg by mouth daily.   INCRUSE ELLIPTA 62.5 MCG/INH Aepb Generic drug:  umeclidinium bromide INHALE 1 PUFFS BY MOUTH ONCE DAILY   omeprazole 20 MG capsule Commonly known as:  PRILOSEC Take 1 capsule (20 mg total) by mouth daily.   potassium chloride SA 20 MEQ tablet Commonly known as:  K-DUR,KLOR-CON Take 20 mEq by mouth 2 (two) times daily.       Allergies: No Known Allergies  Family History: Family History  Problem Relation Age of Onset  . Throat cancer Brother   .  Stroke Father   . Diabetes Mellitus II Father   . Hypertension Father   . Prostate cancer Brother   . Kidney disease Neg Hx   . Kidney cancer Neg Hx   . Bladder Cancer Neg Hx     Social History:  reports that he quit smoking about 22 years ago. He has never used smokeless tobacco. He reports that he does not drink alcohol or use drugs.  ROS: UROLOGY Frequent Urination?: No Hard to postpone urination?: No Burning/pain with urination?: No Get up at night to urinate?: No Leakage of urine?: No Urine stream starts and stops?: No Trouble starting stream?: No Do you have to strain to urinate?: No Blood in urine?: No Urinary tract infection?: No Sexually transmitted disease?: No Injury to kidneys or bladder?: No Painful intercourse?: No Weak stream?: No Erection problems?: No Penile pain?: No  Gastrointestinal Nausea?: No Vomiting?: No Indigestion/heartburn?: No Diarrhea?: No Constipation?: No  Constitutional Fever: No Night sweats?: No Weight loss?: No Fatigue?: No  Skin Skin rash/lesions?: No Itching?: No  Eyes Blurred vision?: No Double vision?: No  Ears/Nose/Throat Sore throat?: No Sinus problems?: No  Hematologic/Lymphatic Swollen glands?: No Easy bruising?: No  Cardiovascular Leg swelling?: No Chest pain?: No  Respiratory Cough?: No Shortness of breath?: No  Endocrine Excessive thirst?: No  Musculoskeletal Back pain?: No Joint pain?: No  Neurological Headaches?: No Dizziness?: No  Psychologic Depression?: No Anxiety?: No  Physical Exam: BP (!) 148/71 (BP Location: Left Arm, Patient Position: Sitting, Cuff Size: Normal)   Pulse 65   Ht 5\' 9"  (1.753 m)   Wt 189 lb 12.8 oz (86.1 kg)   BMI 28.03 kg/m   Constitutional: Well nourished. Alert and oriented, No acute distress. HEENT: [place] AT, moist mucus membranes. Trachea midline, no masses. Cardiovascular: No clubbing, cyanosis, or edema. Respiratory: Normal respiratory effort, no  increased work of breathing. GI: Abdomen is soft, non tender, non distended, no abdominal masses. Liver and spleen not palpable.  No hernias appreciated.  Stool sample for occult testing is not indicated.   GU: No CVA tenderness.  No bladder fullness or masses.  Patient with uncircumcised phallus.  Foreskin easily retracted Urethral meatus is patent.  No penile discharge. No penile lesions or rashes. Scrotum without lesions, cysts, rashes and/or edema.  Testicles are located scrotally bilaterally.  No masses are appreciated in the testicles. Left and right epididymis are normal. Rectal: Patient with  normal sphincter tone. Anus and perineum without scarring or rashes. No rectal masses are appreciated. Prostate is approximately 60 grams, firm in the right lobe, no nodules are appreciated. Seminal vesicles are normal. Skin: No rashes, bruises or suspicious lesions. Lymph: No cervical or inguinal adenopathy. Neurologic: Grossly intact, no focal deficits, moving all 4 extremities. Psychiatric: Normal mood and affect.  Laboratory Data: See HPI I have reviewed the labs  Assessment & Plan:    1. Elevated PSA Current PSA 5.9 Discontinued finasteride due to chest pain His PSA remains above baseline from years prior on recheck times - I recommended to the patient that he undergo a biopsy at this time in order to evaluate for high-grade prostate cancer which we would want to treat as it has greater chance of spreading causing pathological fractures which are detrimental to the quality of life - he is agreeable.  Patient will be schedule for a TRUSPBx of prostate.  The procedure is explained and the risks involved, such as blood in urine, blood in stool, blood in semen, infection, urinary retention, and on rare occasions sepsis and death.  Patient understands the risks as explained to him and he wishes to proceed.  Patient is on ASA and is advised to discontinue the medication ten days prior to the  biopsy.  2. BPH with LUTS IPSS score is 6/2, it is stable Continue conservative management, avoiding bladder irritants and timed voiding's; more water most bothersome symptom is nocturia x 1 Couldn't tolerate the finasteride RTC pending biopsy results  3. Abnormal prostate exam See above  Return for TRUSPBx of prostate; patient on ASA.  Royden Purl  [place] [address]  Addendum:  Patient has chosen to undergo an MRI of the prostate at this time  [place], [place] [address] [phone]

## 2018-07-21 ENCOUNTER — Encounter: Payer: Self-pay | Admitting: Urology

## 2018-07-21 ENCOUNTER — Ambulatory Visit (INDEPENDENT_AMBULATORY_CARE_PROVIDER_SITE_OTHER): Payer: Medicare Other | Admitting: Urology

## 2018-07-21 VITALS — BP 148/71 | HR 65 | Ht 69.0 in | Wt 189.8 lb

## 2018-07-21 DIAGNOSIS — R3989 Other symptoms and signs involving the genitourinary system: Secondary | ICD-10-CM | POA: Diagnosis not present

## 2018-07-21 DIAGNOSIS — N138 Other obstructive and reflux uropathy: Secondary | ICD-10-CM

## 2018-07-21 DIAGNOSIS — N401 Enlarged prostate with lower urinary tract symptoms: Secondary | ICD-10-CM

## 2018-07-21 DIAGNOSIS — R972 Elevated prostate specific antigen [PSA]: Secondary | ICD-10-CM | POA: Diagnosis not present

## 2018-07-23 ENCOUNTER — Telehealth: Payer: Self-pay | Admitting: Urology

## 2018-07-23 NOTE — Telephone Encounter (Signed)
Patient called the office today requesting that we cancel his biopsy appointment.  He says that he has changed his mind and would like to proceed with a prostate MRI.  If this is okay, please place the order for the MRI and I will cancel the biopsy appointment.

## 2018-07-29 NOTE — Telephone Encounter (Signed)
Will you call him with his MRI results?   Sharyn Lull

## 2018-08-02 ENCOUNTER — Other Ambulatory Visit: Payer: Medicare Other | Admitting: Urology

## 2018-08-16 ENCOUNTER — Ambulatory Visit: Payer: Medicare Other | Admitting: Urology

## 2018-09-14 DIAGNOSIS — Z23 Encounter for immunization: Secondary | ICD-10-CM | POA: Diagnosis not present

## 2018-09-30 ENCOUNTER — Telehealth: Payer: Self-pay | Admitting: Urology

## 2018-09-30 NOTE — Telephone Encounter (Signed)
Called the patient and spoke with someone at his home, gave them the number to [facility] and they will have him call them to schedule the MRI since no one from [facility] has contacted the patient yet to schedule. I have asked that the patient call the office back to schedule a follow up for results.  Sharyn Lull

## 2018-10-19 ENCOUNTER — Telehealth: Payer: Self-pay | Admitting: Urology

## 2018-10-19 NOTE — Telephone Encounter (Signed)
PT CALLED TO GET MRI SCHEDULED. WHEN PT FOUND OUT HE HAD TO COME TO [place] TO HAVE DONE HE STATED THAT HECOULDNT COME TO  TO HAVE DONE DUE TO TRANSPORTATION AND HE WOULD CALL BACK LATER 10/24  This was the message I got from [place] about the patient's prostate MRI    Sharyn Lull

## 2018-11-29 ENCOUNTER — Emergency Department
Admission: EM | Admit: 2018-11-29 | Discharge: 2018-11-29 | Disposition: A | Discharge status: Home / Self Care | Payer: Medicare Other | Attending: Emergency Medicine | Admitting: Emergency Medicine

## 2018-11-29 ENCOUNTER — Encounter: Payer: Self-pay | Admitting: Emergency Medicine

## 2018-11-29 ENCOUNTER — Other Ambulatory Visit: Payer: Self-pay

## 2018-11-29 ENCOUNTER — Emergency Department: Payer: Medicare Other

## 2018-11-29 DIAGNOSIS — Z79899 Other long term (current) drug therapy: Secondary | ICD-10-CM | POA: Insufficient documentation

## 2018-11-29 DIAGNOSIS — J45909 Unspecified asthma, uncomplicated: Secondary | ICD-10-CM | POA: Insufficient documentation

## 2018-11-29 DIAGNOSIS — I10 Essential (primary) hypertension: Secondary | ICD-10-CM | POA: Diagnosis not present

## 2018-11-29 DIAGNOSIS — R51 Headache: Secondary | ICD-10-CM | POA: Diagnosis not present

## 2018-11-29 DIAGNOSIS — Z87891 Personal history of nicotine dependence: Secondary | ICD-10-CM | POA: Diagnosis not present

## 2018-11-29 DIAGNOSIS — R519 Headache, unspecified: Secondary | ICD-10-CM

## 2018-11-29 DIAGNOSIS — Z7982 Long term (current) use of aspirin: Secondary | ICD-10-CM | POA: Insufficient documentation

## 2018-11-29 NOTE — ED Triage Notes (Signed)
Pt presents to ED via POV with c/o HA since Friday Night. Pt states attempt x 1 with Advil without relief. Pt states hx of aneurysm that was clipped. Pt denies changes in LOC, changes in speech at this time. Pt with mild stutter at baseline.

## 2018-11-29 NOTE — ED Triage Notes (Signed)
Pt states HA is intermittent. Denies any pain at this time. Spoke with Dr. Joni Fears regarding patient care, VORB for Head CT WO contrast.

## 2018-11-29 NOTE — ED Provider Notes (Signed)
[facility] Emergency Department Provider Note   ____________________________________________   First MD Initiated Contact with Patient 11/29/18 1129     (approximate)  I have reviewed the triage vital signs and the nursing notes.   HISTORY  Chief Complaint Headache    HPI Anthony Meyers is a 72 y.o. male who is here for evaluation of a headache.  Reports that this time the headache is gone, he has not had a headache today it went mostly away yesterday after taking ibuprofen.  He has however experienced a headache over the last about 3 days will come and go, is rather mild, not sudden onset, and not severe.  Reports it is located over the lower right part of his skull and just is on the right side around his lower skull.  Sometimes feels like it is a little bit of a discomfort in his right upper neck.  No falls or injuries.  No numbness tingling or weakness.  No visual changes.  No fevers or chills.  Does have a history of a previous clipped brain aneurysm from over 10 years ago.  He told his daughter that he was having his headaches and she recommended he come to the ER be evaluated as he does have a history of an aneurysm   Patient denies any ongoing symptoms right now reports he feels quite fine.  No symptoms or headache at all at this present time.  He has not followed up with a neurosurgeon in many years.  He does occasionally get headaches similar nature, usually does not have to take anything for it but took one ibuprofen for it yesterday which helped.  He does have a slight stutter, that is a chronic issue since his previous aneurysm no changes.  Past Medical History:  Diagnosis Date  . Asthma   . BPH (benign prostatic hypertrophy)   . Brain aneurysm   . Chronic prostatitis   . Elevated PSA   . GERD (gastroesophageal reflux disease)   . HTN (hypertension)     Patient Active Problem List   Diagnosis Date Noted  . Acute pancreatitis 05/23/2017    . HTN (hypertension) 05/23/2017  . Pancreatitis 05/23/2017  . BPH with obstruction/lower urinary tract symptoms 03/13/2016  . Elevated PSA 03/13/2016  . Chronic obstructive pulmonary disease ([place]) 09/13/2015  . GERD (gastroesophageal reflux disease) 09/13/2015    Past Surgical History:  Procedure Laterality Date  . BRAIN SURGERY    . CHOLECYSTECTOMY N/A 05/26/2017   Procedure: LAPAROSCOPIC CHOLECYSTECTOMY WITH INTRAOPERATIVE CHOLANGIOGRAM;  Surgeon: Florene Glen, MD;  Location: [facility] ORS;  Service: General;  Laterality: N/A;  . COLONOSCOPY WITH PROPOFOL N/A 12/04/2015   Procedure: COLONOSCOPY WITH PROPOFOL;  Surgeon: Lollie Sails, MD;  Location: [facility] ENDOSCOPY;  Service: Endoscopy;  Laterality: N/A;  . LUNG SURGERY Left     Prior to Admission medications   Medication Sig Start Date End Date Taking? Authorizing Provider  aspirin EC 81 MG tablet Take 81 mg by mouth daily.    [provider]  budesonide-formoterol (SYMBICORT) 160-4.5 MCG/ACT inhaler Inhale 2 puffs into the lungs 2 (two) times daily.    [provider]  finasteride (PROSCAR) 5 MG tablet Take 1 tablet (5 mg total) by mouth daily. 04/21/18   Zara Council A, PA-C  hydrochlorothiazide (HYDRODIURIL) 25 MG tablet Take 25 mg by mouth daily.    [provider]  omeprazole (PRILOSEC) 20 MG capsule Take 1 capsule (20 mg total) by mouth daily. 05/29/17 05/29/18  Gouru, Illene Silver, MD  potassium chloride SA (K-DUR,KLOR-CON) 20 MEQ tablet Take 20 mEq by mouth 2 (two) times daily.     [provider]  Umeclidinium Bromide (INCRUSE ELLIPTA) 62.5 MCG/INH AEPB INHALE 1 PUFFS BY MOUTH ONCE DAILY 08/08/15   [provider]    Allergies Patient has no known allergies.  Family History  Problem Relation Age of Onset  . Throat cancer Brother   . Stroke Father   . Diabetes Mellitus II Father   . Hypertension Father   . Prostate cancer Brother   . Kidney disease Neg Hx   . Kidney cancer  Neg Hx   . Bladder Cancer Neg Hx     Social History Social History   Tobacco Use  . Smoking status: Former Smoker    Last attempt to quit: 09/13/1995    Years since quitting: 23.2  . Smokeless tobacco: Never Used  Substance Use Topics  . Alcohol use: No    Alcohol/week: 0.0 standard drinks  . Drug use: No    Review of Systems Constitutional: No fever/chills Eyes: No visual changes.  No eye pain. ENT: No sore throat. Cardiovascular: Denies chest pain. Respiratory: Denies shortness of breath. Musculoskeletal: Negative for back pain. Skin: Negative for rash. Neurological: Negative for areas of focal weakness or numbness.    ____________________________________________   PHYSICAL EXAM:  VITAL SIGNS: ED Triage Vitals [11/29/18 1039]  Enc Vitals Group     BP 126/68     Pulse Rate 72     Resp 18     Temp 98 F (36.7 C)     Temp Source Oral     SpO2 96 %     Weight 180 lb (81.6 kg)     Height 5\' 9"  (1.753 m)     Head Circumference      Peak Flow      Pain Score 0     Pain Loc      Pain Edu?      Excl. in [place]?     Constitutional: Alert and oriented. Well appearing and in no acute distress.  Sitting pleasantly in bed, in no distress. Eyes: Conjunctivae are normal. Head: Atraumatic.  No temporal artery tenderness.  No tenderness to palpation anywhere in the occiput. Nose: No congestion/rhinnorhea. Mouth/Throat: Mucous membranes are moist. Neck: No stridor.  Cardiovascular: Normal rate, regular rhythm. Grossly normal heart sounds.  Good peripheral circulation. Respiratory: Normal respiratory effort.  No retractions. Lungs CTAB. Gastrointestinal: Soft and nontender. No distention. Musculoskeletal: No lower extremity tenderness nor edema. Neurologic:  Normal speech and language. No gross focal neurologic deficits are appreciated.  Cranial nerve examination is normal.  5 out of 5 strength in all extremities.  No pronator drift.  Normal sensation in all  extremities. Skin:  Skin is warm, dry and intact. No rash noted. Psychiatric: Mood and affect are normal. Speech and behavior are normal except for very slight occasional stuttering speech with both he and his daughter report is normal for him.  ____________________________________________   LABS (all labs ordered are listed, but only abnormal results are displayed)  Labs Reviewed - No data to display ____________________________________________  EKG   ____________________________________________  RADIOLOGY   CT head reviewed by me and discussed with Dr. Lacinda Axon.  No acute findings ____________________________________________   PROCEDURES  Procedure(s) performed: None  Procedures  Critical Care performed: No  ____________________________________________   INITIAL IMPRESSION / ASSESSMENT AND PLAN / ED COURSE  Pertinent labs & imaging results that were available  during my care of the patient were reviewed by me and considered in my medical decision making (see chart for details).   Differential diagnosis includes, but is not limited to, intracranial hemorrhage, meningitis/encephalitis, previous head trauma, cavernous venous thrombosis, tension headache, temporal arteritis, migraine or migraine equivalent, idiopathic intracranial hypertension, and non-specific headache.  Based upon his presentation I do not suspect acute life-threatening causes headache.  Does not appear to be consistent with headache it would be caused from a ruptured aneurysm.  No evidence of arteritis.  Very reassuring exam and his headache is actually no longer present.  I suspect this was some type of a slight tension or occipital headache given the distribution that he describes it in, and his examination today is very reassuring.  Normal vital signs.  Normal neurologic exam.  Symptoms resolved.  Clinical Course as of Nov 29 1154  Mon Nov 29, 2018  1146 Dr. Lacinda Axon advises no need for further imaging or or  CTA at this time. Patient no headache, asymptomatic.    [MQ]    Clinical Course User Index [MQ] Delman Kitten, MD   Discussed with patient, he will follow-up with either [facility] neurosurgery or Dr. Lacinda Axon at some point in the future.  He is agreeable with this.  Dr. Lacinda Axon did advise that the patient should continue to have surveillance at least every few years or at least a visit to neurosurgery every few years, it sounds that he is not currently following a neurosurgeon.  Patient is understanding this.  I did discuss with both the patient and his daughter some very careful return precautions including fevers to have a sudden severe headache, confusion, nausea and vomiting, fever, or other new concerns to come back to the emergency room right away.  He is in agreement with this.  Return precautions and treatment recommendations and follow-up discussed with the patient who is agreeable with the plan.   ____________________________________________   FINAL CLINICAL IMPRESSION(S) / ED DIAGNOSES  Final diagnoses:  Unilateral occipital headache        Note:  This document was prepared using Dragon voice recognition software and may include unintentional dictation errors       Delman Kitten, MD 11/30/18 [phone]

## 2019-01-19 DIAGNOSIS — R51 Headache: Secondary | ICD-10-CM | POA: Diagnosis not present

## 2019-01-19 DIAGNOSIS — I10 Essential (primary) hypertension: Secondary | ICD-10-CM | POA: Diagnosis not present

## 2019-08-03 DIAGNOSIS — E876 Hypokalemia: Secondary | ICD-10-CM | POA: Diagnosis not present

## 2019-08-03 DIAGNOSIS — R972 Elevated prostate specific antigen [PSA]: Secondary | ICD-10-CM | POA: Diagnosis not present

## 2019-08-03 DIAGNOSIS — I10 Essential (primary) hypertension: Secondary | ICD-10-CM | POA: Diagnosis not present

## 2019-08-03 DIAGNOSIS — Z0000 Encounter for general adult medical examination without abnormal findings: Secondary | ICD-10-CM | POA: Diagnosis not present

## 2019-08-03 DIAGNOSIS — J449 Chronic obstructive pulmonary disease, unspecified: Secondary | ICD-10-CM | POA: Diagnosis not present

## 2019-08-03 DIAGNOSIS — Z23 Encounter for immunization: Secondary | ICD-10-CM | POA: Diagnosis not present

## 2019-08-03 DIAGNOSIS — K219 Gastro-esophageal reflux disease without esophagitis: Secondary | ICD-10-CM | POA: Diagnosis not present

## 2019-08-16 DIAGNOSIS — Z23 Encounter for immunization: Secondary | ICD-10-CM | POA: Diagnosis not present

## 2019-08-22 DIAGNOSIS — E876 Hypokalemia: Secondary | ICD-10-CM | POA: Diagnosis not present

## 2019-09-09 DIAGNOSIS — Z23 Encounter for immunization: Secondary | ICD-10-CM | POA: Diagnosis not present

## 2020-03-01 ENCOUNTER — Emergency Department
Admission: EM | Admit: 2020-03-01 | Discharge: 2020-03-01 | Disposition: A | Discharge status: Home / Self Care | Payer: Medicare Other | Attending: Emergency Medicine | Admitting: Emergency Medicine

## 2020-03-01 ENCOUNTER — Other Ambulatory Visit: Payer: Self-pay

## 2020-03-01 DIAGNOSIS — M25512 Pain in left shoulder: Secondary | ICD-10-CM | POA: Insufficient documentation

## 2020-03-01 DIAGNOSIS — Z79899 Other long term (current) drug therapy: Secondary | ICD-10-CM | POA: Diagnosis not present

## 2020-03-01 DIAGNOSIS — S199XXA Unspecified injury of neck, initial encounter: Secondary | ICD-10-CM | POA: Diagnosis present

## 2020-03-01 DIAGNOSIS — Y999 Unspecified external cause status: Secondary | ICD-10-CM | POA: Insufficient documentation

## 2020-03-01 DIAGNOSIS — Y939 Activity, unspecified: Secondary | ICD-10-CM | POA: Diagnosis not present

## 2020-03-01 DIAGNOSIS — X58XXXA Exposure to other specified factors, initial encounter: Secondary | ICD-10-CM | POA: Insufficient documentation

## 2020-03-01 DIAGNOSIS — J45909 Unspecified asthma, uncomplicated: Secondary | ICD-10-CM | POA: Diagnosis not present

## 2020-03-01 DIAGNOSIS — S161XXA Strain of muscle, fascia and tendon at neck level, initial encounter: Secondary | ICD-10-CM

## 2020-03-01 DIAGNOSIS — Z87891 Personal history of nicotine dependence: Secondary | ICD-10-CM | POA: Insufficient documentation

## 2020-03-01 DIAGNOSIS — Y929 Unspecified place or not applicable: Secondary | ICD-10-CM | POA: Diagnosis not present

## 2020-03-01 DIAGNOSIS — Z23 Encounter for immunization: Secondary | ICD-10-CM | POA: Diagnosis not present

## 2020-03-01 DIAGNOSIS — I10 Essential (primary) hypertension: Secondary | ICD-10-CM | POA: Insufficient documentation

## 2020-03-01 DIAGNOSIS — Z7982 Long term (current) use of aspirin: Secondary | ICD-10-CM | POA: Insufficient documentation

## 2020-03-01 DIAGNOSIS — J449 Chronic obstructive pulmonary disease, unspecified: Secondary | ICD-10-CM | POA: Insufficient documentation

## 2020-03-01 LAB — COMPREHENSIVE METABOLIC PANEL
ALT: 24 U/L (ref 0–44)
AST: 26 U/L (ref 15–41)
Albumin: 3.8 g/dL (ref 3.5–5.0)
Alkaline Phosphatase: 57 U/L (ref 38–126)
Anion gap: 11 (ref 5–15)
BUN: 15 mg/dL (ref 8–23)
CO2: 24 mmol/L (ref 22–32)
Calcium: 9.1 mg/dL (ref 8.9–10.3)
Chloride: 102 mmol/L (ref 98–111)
Creatinine, Ser: 0.85 mg/dL (ref 0.61–1.24)
GFR calc Af Amer: >60 mL/min (ref >60)
GFR calc non Af Amer: >60 mL/min (ref >60)
Glucose, Bld: 115 mg/dL — ABNORMAL HIGH (ref 70–99)
Potassium: 3.2 mmol/L — ABNORMAL LOW (ref 3.5–5.1)
Sodium: 137 mmol/L (ref 135–145)
Total Bilirubin: 1.1 mg/dL (ref 0.3–1.2)
Total Protein: 7.6 g/dL (ref 6.5–8.1)

## 2020-03-01 LAB — CBC
HCT: 39.7 % (ref 39.0–52.0)
Hemoglobin: 13 g/dL (ref 13.0–17.0)
MCH: 29.3 pg (ref 26.0–34.0)
MCHC: 32.7 g/dL (ref 30.0–36.0)
MCV: 89.6 fL (ref 80.0–100.0)
Platelets: 208 K/uL (ref 150–400)
RBC: 4.43 MIL/uL (ref 4.22–5.81)
RDW: 12 % (ref 11.5–15.5)
WBC: 5.9 K/uL (ref 4.0–10.5)
nRBC: 0 % (ref 0.0–0.2)

## 2020-03-01 LAB — TROPONIN I (HIGH SENSITIVITY): Troponin I (High Sensitivity): <2 ng/L

## 2020-03-01 MED ORDER — POTASSIUM CHLORIDE CRYS ER 20 MEQ PO TBCR
40.0000 meq | EXTENDED_RELEASE_TABLET | Freq: Once | ORAL | Status: AC
  Administered 2020-03-01: 40 meq via ORAL
  Filled 2020-03-01: qty 2

## 2020-03-01 MED ORDER — ACETAMINOPHEN 500 MG PO TABS
1000.0000 mg | ORAL_TABLET | Freq: Once | ORAL | Status: AC
  Administered 2020-03-01: 1000 mg via ORAL
  Filled 2020-03-01: qty 2

## 2020-03-01 MED ORDER — KETOROLAC TROMETHAMINE 30 MG/ML IJ SOLN
30.0000 mg | Freq: Once | INTRAMUSCULAR | Status: AC
  Administered 2020-03-01: 05:00:00 30 mg via INTRAMUSCULAR
  Filled 2020-03-01: qty 1

## 2020-03-01 MED ORDER — CYCLOBENZAPRINE HCL 5 MG PO TABS: 5.0000 mg | ORAL_TABLET | Freq: Every day | ORAL | 0 refills | Status: AC

## 2020-03-01 MED ORDER — LIDOCAINE 5 % EX PTCH
1.0000 | MEDICATED_PATCH | CUTANEOUS | Status: DC
  Administered 2020-03-01: 1 via TRANSDERMAL
  Filled 2020-03-01: qty 1

## 2020-03-01 NOTE — Discharge Instructions (Addendum)
Start taking Tylenol 1 g every 8 hours and combine that with ibuprofen 400 every 6 hours with food.  If you decide not to use the ibuprofen you can use naproxen instead but make sure you follow the dosage on the bottle so you do not take too much of it.  We have placed a patch also to help with pain.  At nighttime you can take 1 dose of the Flexeril to help with muscle relaxation.  This can increase your risk of falls which is why I am only prescribing it for at nighttime.  If you are still continuing to have pain after 1 week you should follow-up with your primary care doctor.  Return to the ER if you develop loss of consciousness, severe headaches, shortness of breath or any other concerns  Your potassium was slightly low and Blood pressure slightly high and this can be followed up with your primary doctor.

## 2020-03-01 NOTE — ED Provider Notes (Signed)
[facility] Emergency Department Provider Note  ____________________________________________   First MD Initiated Contact with Patient 03/01/20 0404     (approximate)  I have reviewed the triage vital signs and the nursing notes.   HISTORY  Chief Complaint Shoulder Pain    HPI Anthony Meyers is a 74 y.o. male with hypertension, and asthma who comes in with shoulder pain. Patient endoerses pain on the left side of his neck.  Patient states that he initially had pain in his arm 2 weeks ago but that pain is now resolved.  He now has pain in his left side of his neck that started 1 week ago.  Denies any trauma.  Patient states the pain is worse when he first wakes up in the morning he has to hold his head up with his hands in order to try to get out of bed.  He states the pain is worse moving his head side to side.  The pain is constant, not better with naproxen.  He denies any dizziness, loss of consciousness, neck manipulation with chiropractor, headaches, chest pain, shortness of breath, injuries.            Past Medical History:  Diagnosis Date  . Asthma   . BPH (benign prostatic hypertrophy)   . Brain aneurysm   . Chronic prostatitis   . Elevated PSA   . GERD (gastroesophageal reflux disease)   . HTN (hypertension)     Patient Active Problem List   Diagnosis Date Noted  . Acute pancreatitis 05/23/2017  . HTN (hypertension) 05/23/2017  . Pancreatitis 05/23/2017  . BPH with obstruction/lower urinary tract symptoms 03/13/2016  . Elevated PSA 03/13/2016  . Chronic obstructive pulmonary disease ([place]) 09/13/2015  . GERD (gastroesophageal reflux disease) 09/13/2015    Past Surgical History:  Procedure Laterality Date  . BRAIN SURGERY    . CHOLECYSTECTOMY N/A 05/26/2017   Procedure: LAPAROSCOPIC CHOLECYSTECTOMY WITH INTRAOPERATIVE CHOLANGIOGRAM;  Surgeon: Florene Glen, MD;  Location: [facility] ORS;  Service: General;  Laterality: N/A;  .  COLONOSCOPY WITH PROPOFOL N/A 12/04/2015   Procedure: COLONOSCOPY WITH PROPOFOL;  Surgeon: Lollie Sails, MD;  Location: [facility] ENDOSCOPY;  Service: Endoscopy;  Laterality: N/A;  . LUNG SURGERY Left     Prior to Admission medications   Medication Sig Start Date End Date Taking? Authorizing Provider  aspirin EC 81 MG tablet Take 81 mg by mouth daily.    [provider]  budesonide-formoterol (SYMBICORT) 160-4.5 MCG/ACT inhaler Inhale 2 puffs into the lungs 2 (two) times daily.    [provider]  finasteride (PROSCAR) 5 MG tablet Take 1 tablet (5 mg total) by mouth daily. 04/21/18   Zara Council A, PA-C  hydrochlorothiazide (HYDRODIURIL) 25 MG tablet Take 25 mg by mouth daily.    [provider]  omeprazole (PRILOSEC) 20 MG capsule Take 1 capsule (20 mg total) by mouth daily. 05/29/17 05/29/18  Nicholes Mango, MD  potassium chloride SA (K-DUR,KLOR-CON) 20 MEQ tablet Take 20 mEq by mouth 2 (two) times daily.     [provider]  Umeclidinium Bromide (INCRUSE ELLIPTA) 62.5 MCG/INH AEPB INHALE 1 PUFFS BY MOUTH ONCE DAILY 08/08/15   [provider]    Allergies Patient has no known allergies.  Family History  Problem Relation Age of Onset  . Throat cancer Brother   . Stroke Father   . Diabetes Mellitus II Father   . Hypertension Father   . Prostate cancer Brother   . Kidney disease Neg  Hx   . Kidney cancer Neg Hx   . Bladder Cancer Neg Hx     Social History Social History   Tobacco Use  . Smoking status: Former Smoker    Quit date: 09/13/1995    Years since quitting: 24.4  . Smokeless tobacco: Never Used  Substance Use Topics  . Alcohol use: No    Alcohol/week: 0.0 standard drinks  . Drug use: No      Review of Systems Constitutional: No fever/chills Eyes: No visual changes. ENT: No sore throat.  Neck pain Cardiovascular: Denies chest pain. Respiratory: Denies shortness of breath. Gastrointestinal: No abdominal pain.  No  nausea, no vomiting.  No diarrhea.  No constipation. Genitourinary: Negative for dysuria. Musculoskeletal: Negative for back pain. Skin: Negative for rash. Neurological: Negative for headaches, focal weakness or numbness. All other ROS negative ____________________________________________   PHYSICAL EXAM:  VITAL SIGNS: ED Triage Vitals  Enc Vitals Group     BP 03/01/20 0115 (!) 156/74     Pulse Rate 03/01/20 0115 81     Resp 03/01/20 0115 20     Temp 03/01/20 0115 98.6 F (37 C)     Temp Source 03/01/20 0115 Oral     SpO2 03/01/20 0115 95 %     Weight 03/01/20 0116 198 lb (89.8 kg)     Height 03/01/20 0116 5\' 9"  (1.753 m)     Head Circumference --      Peak Flow --      Pain Score 03/01/20 0115 10     Pain Loc --      Pain Edu? --      Excl. in [place]? --     Constitutional: Alert and oriented. Well appearing and in no acute distress. Eyes: Conjunctivae are normal. EOMI. Head: Atraumatic. Nose: No congestion/rhinnorhea. Mouth/Throat: Mucous membranes are moist.   Neck: Patient able to range his neck although does have pain with movements side to side.  He is tender on palpation of the left side of the neck.  He is got no midline C-spine tenderness.  No pain in the shoulder or the arm.  Good distal pulse in his arms. Cardiovascular: Normal rate, regular rhythm. Grossly normal heart sounds.  Good peripheral circulation. Respiratory: Normal respiratory effort.  No retractions. Lungs CTAB. Gastrointestinal: Soft and nontender. No distention. No abdominal bruits.  Musculoskeletal: No lower extremity tenderness nor edema.  No joint effusions. Neurologic:  Normal speech and language. No gross focal neurologic deficits are appreciated.  Skin:  Skin is warm, dry and intact. No rash noted. Psychiatric: Mood and affect are normal. Speech and behavior are normal. GU: Deferred   ____________________________________________   LABS (all labs ordered are listed, but only abnormal results  are displayed)  Labs Reviewed  COMPREHENSIVE METABOLIC PANEL - Abnormal; Notable for the following components:      Result Value   Potassium 3.2 (*)    Glucose, Bld 115 (*)    All other components within normal limits  CBC  TROPONIN I (HIGH SENSITIVITY)  TROPONIN I (HIGH SENSITIVITY)   ____________________________________________   ED ECG REPORT I, Vanessa Towson, the attending physician, personally viewed and interpreted this ECG.  EKG is sinus rate of 81 with no ST elevations, no T wave inversions, may be some LVH, some sinus arrhythmia ____________________________________________ INITIAL IMPRESSION / ASSESSMENT AND PLAN / ED COURSE  Fenner Probus was evaluated in Emergency Department on 03/01/2020 for the symptoms described in the history of present illness. He was evaluated  in the context of the global COVID-19 pandemic, which necessitated consideration that the patient might be at risk for infection with the SARS-CoV-2 virus that causes COVID-19. Institutional protocols and algorithms that pertain to the evaluation of patients at risk for COVID-19 are in a state of rapid change based on information released by regulatory bodies including the CDC and federal and state organizations. These policies and algorithms were followed during the patient's care in the ED.  \  Patient presents with left-sided neck pain worse in the morning after trying to get out of bed as well as with movements.  This sounds very musculoskeletal in nature.  Given patient's age EKG and cardiac markers were obtained to rule out ACS.  Patient has no shortness of breath and has breath sounds bilaterally so unlikely pneumothorax given no chest pain or shortness of breath.  Patient has no evidence of septic joint upon examination.  He said good distal pulse.  I do not think this is a dissection or PE given his description of it.  He has no falls or midline tenderness to suggest cervical fracture.  Patient is a history of  aneurysms but no headaches to suggest that.  No chiropractor use or lightheadedness to suggest carotid dissection.  Labs are reassuring. No evidence of anemia. No white count elevation. Potassium slightly low give some oral repletion Troponin was less than 2 and his symptoms have been going on for greater than 1 week so I do not think we need repeat  Patient is slightly hypertensive and will have patient follow that up with his primary care doctor.  We discussed symptomatic treatment with Tylenol and ibuprofen/naproxen.  Discussed with family and they would like something else stronger for the pain.  We discussed using a short course of Flexeril at nighttime only due to the risk of causing falls.  They expressed understanding and will be cautious with this medication.  If his pain is still continuing he will follow-up with his primary doctor  I discussed the provisional nature of ED diagnosis, the treatment so far, the ongoing plan of care, follow up appointments and return precautions with the patient and any family or support people present. They expressed understanding and agreed with the plan, discharged home. ____________________________________________   FINAL CLINICAL IMPRESSION(S) / ED DIAGNOSES   Final diagnoses:  Neck strain, initial encounter      MEDICATIONS GIVEN DURING THIS VISIT:  Medications  acetaminophen (TYLENOL) tablet 1,000 mg (has no administration in time range)  potassium chloride SA (KLOR-CON) CR tablet 40 mEq (has no administration in time range)  lidocaine (LIDODERM) 5 % 1 patch (has no administration in time range)  ketorolac (TORADOL) 30 MG/ML injection 30 mg (has no administration in time range)     ED Discharge Orders         Ordered    cyclobenzaprine (FLEXERIL) 5 MG tablet  Daily at bedtime     03/01/20 0424           Note:  This document was prepared using Dragon voice recognition software and may include unintentional dictation errors.     Vanessa [place], MD 03/01/20 [phone]

## 2020-03-01 NOTE — ED Triage Notes (Signed)
Pt in with co left shoulder and neck pain that started a week ago. Pt denies any injury or cause. States pain worse when he lays back. It does not worsen with movement or lifting.

## 2020-08-08 IMAGING — CT CT HEAD W/O CM
3 series · 15 of 47 positions shown, 18 images · non-contrast
Comparison: None available.

CLINICAL DATA: Headache since [REDACTED] night. History of prior
aneurysm clipping.

EXAM:
CT HEAD WITHOUT CONTRAST
TECHNIQUE: Contiguous axial images were obtained from the base of the skull
through the vertex without intravenous contrast.

[Series 2: head wo · axial · 0.38mm/px · z∈[+1229,+1354]mm · 9 of 30 slices shown, 12 images]
[im 3/30  brain]
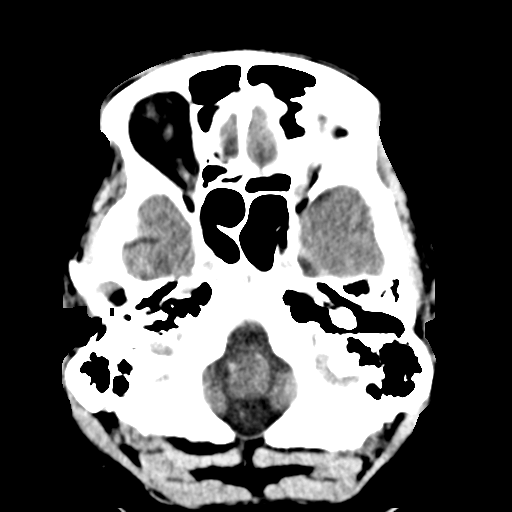
[im 3/30  bone]
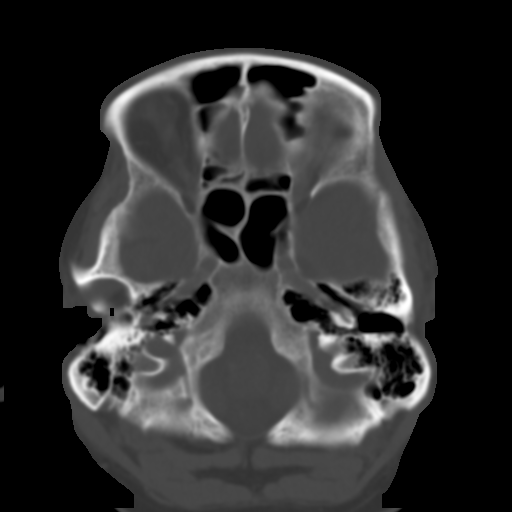
[im 6/30  brain]
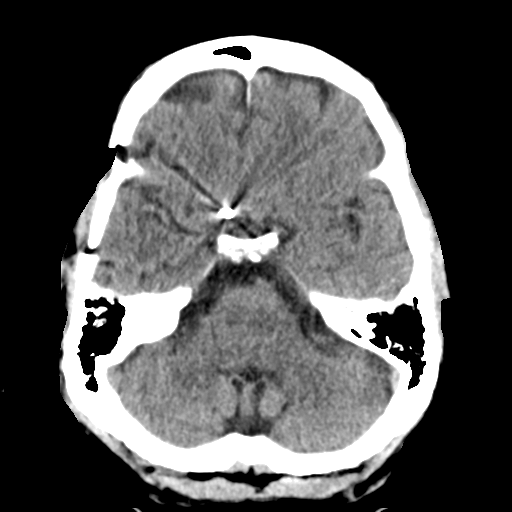
[im 9/30  brain]
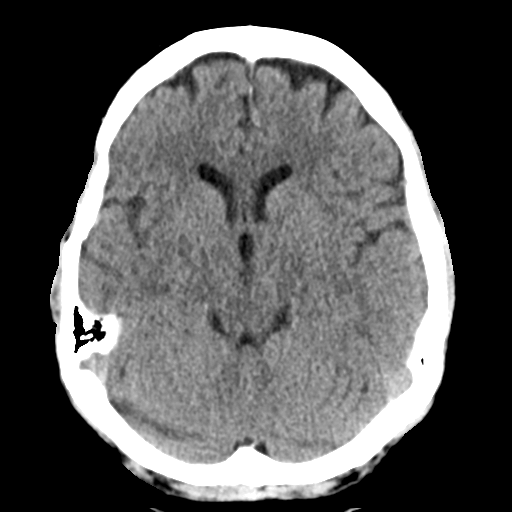
[im 12/30  brain]
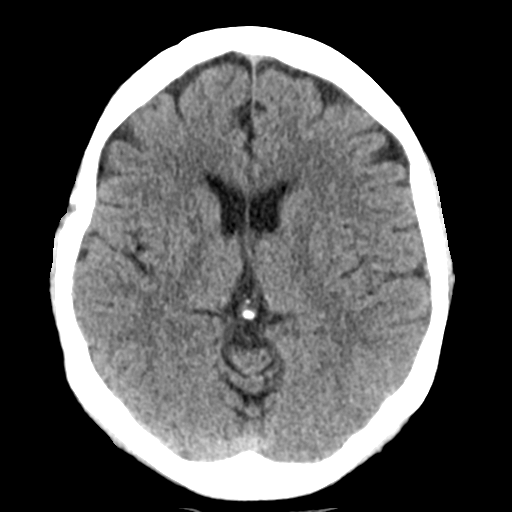
[im 16/30  brain]
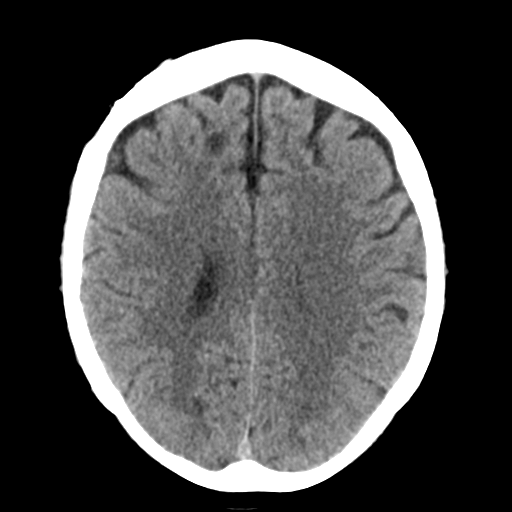
[im 16/30  bone]
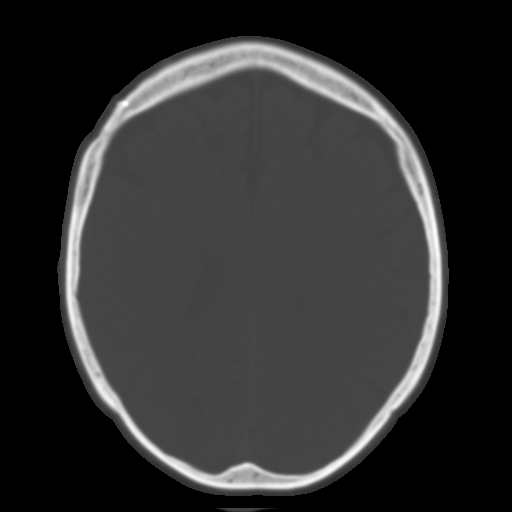
[im 19/30  brain]
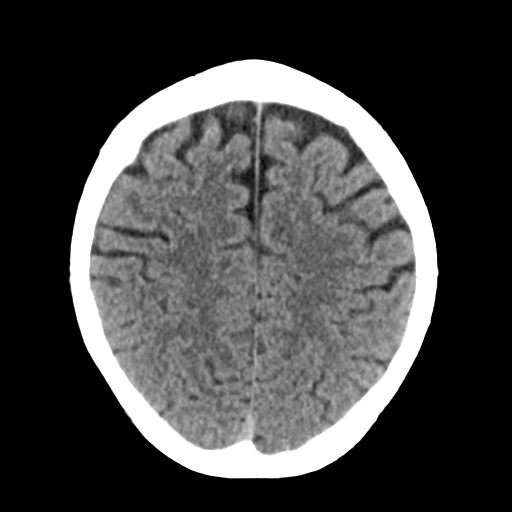
[im 22/30  brain]
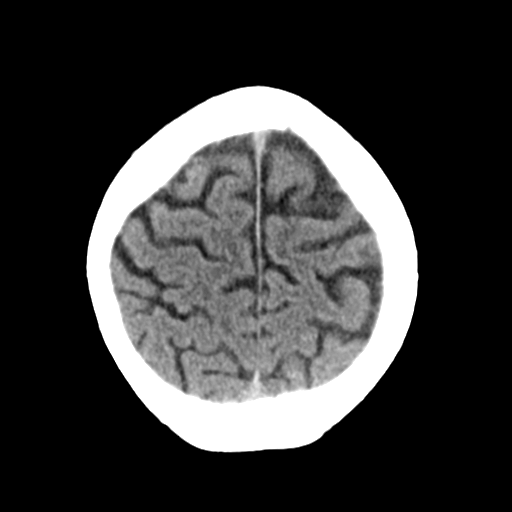
[im 25/30  brain]
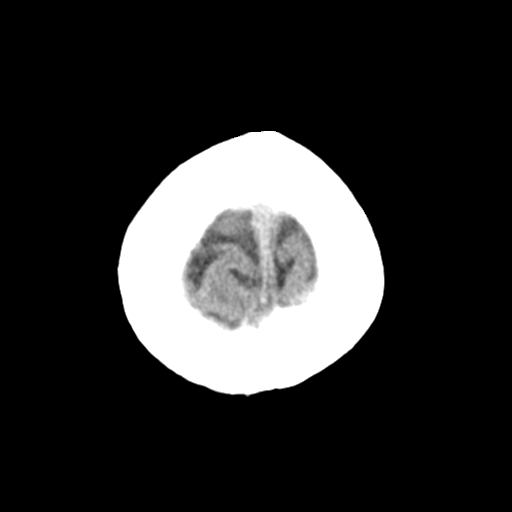
[im 28/30  brain]
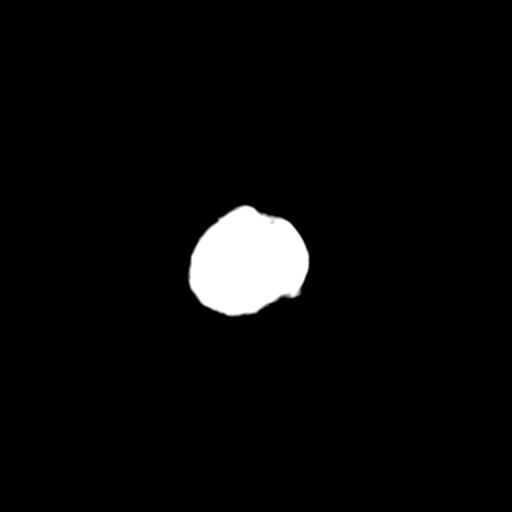
[im 28/30  bone]
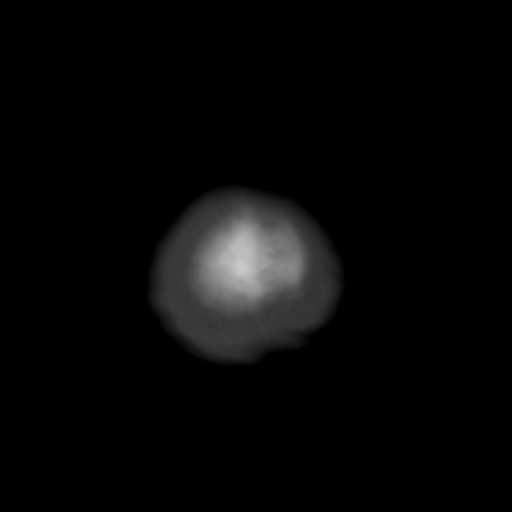

[Series 4: coronal soft tissue · coronal · 0.36mm/px · 3 of 69 slices shown]
[im 23/69  brain]
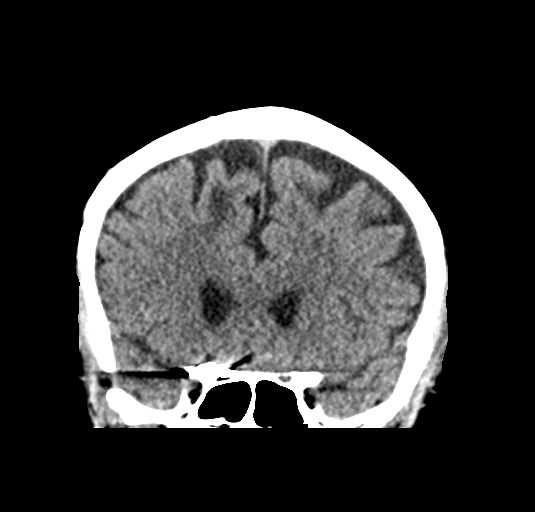
[im 31/69  brain]
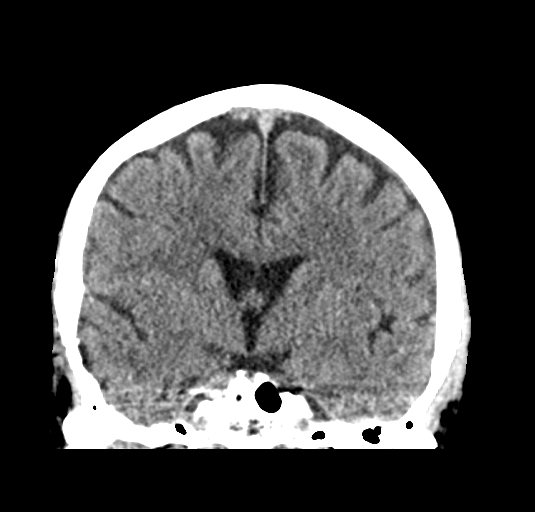
[im 38/69  brain]
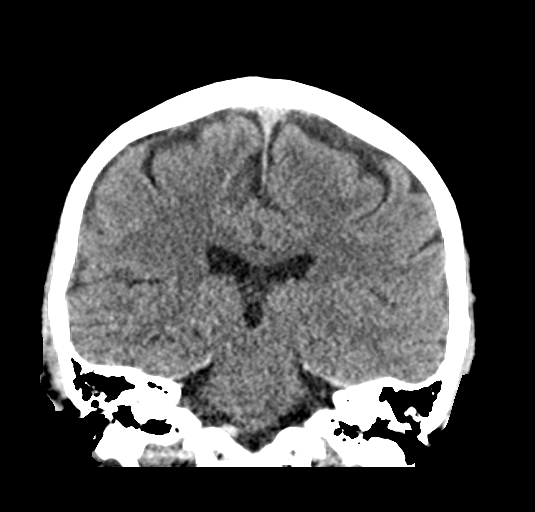

[Series 5: sagittal soft tissue · sagittal · 0.31mm/px · 3 of 57 slices shown]
[im 19/57  brain]
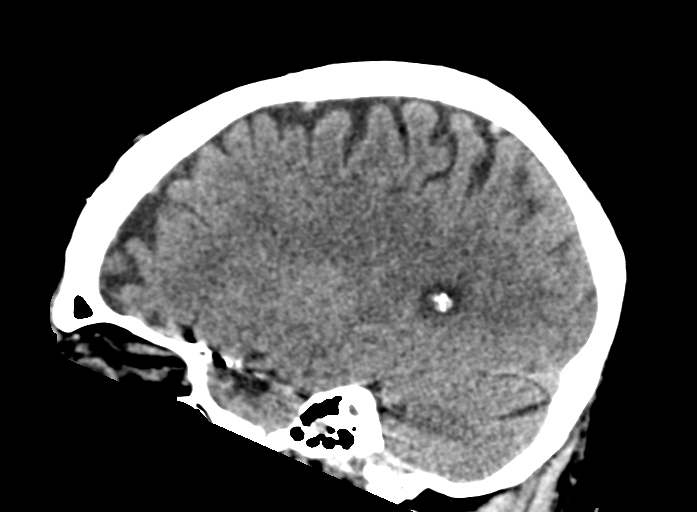
[im 29/57  brain]
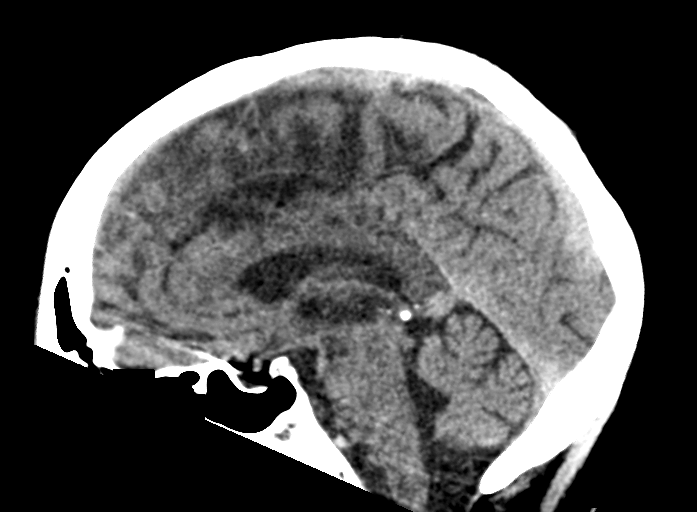
[im 38/57  brain]
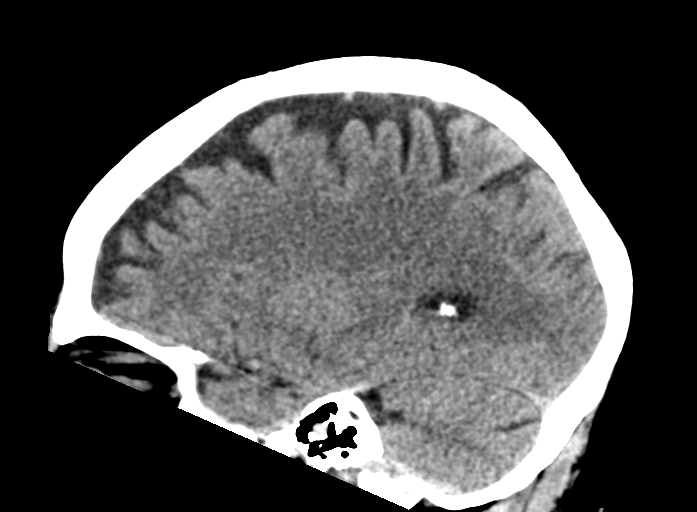

[15 of 47 positions shown; findings below may reference images not displayed]

FINDINGS: Brain: There is artifact related to an aneurysm clip near the right
circle-of-Willis. Surgical changes from a right temporal craniotomy.
No complicating features are identified. I do not see any evidence
of acute subarachnoid hemorrhage.

The ventricles are in the midline without mass effect or shift. No
extra-axial fluid collections are identified. No findings for
hemispheric infarction or intracranial hemorrhage. The brainstem and
cerebellum are grossly normal.

Vascular: Minimal vascular calcifications. No hyperdense vessels or
definite aneurysm.

Skull: Surgical changes from right temporal craniotomy. No acute
bony findings or bone lesion.

Sinuses/Orbits: The paranasal sinuses and mastoid air cells are
grossly clear. Scattered mucoperiosteal thickening involving the
ethmoid air cells. The globes are intact.

Other: No scalp lesions or hematoma.
IMPRESSION: 1. Surgical changes related to prior aneurysm clipping with a right
temporal craniotomy.
2. No acute intracranial findings or mass lesions.

## 2021-11-10 ENCOUNTER — Other Ambulatory Visit: Payer: Self-pay

## 2021-11-10 ENCOUNTER — Emergency Department: Payer: Medicare Other

## 2021-11-10 DIAGNOSIS — J45909 Unspecified asthma, uncomplicated: Secondary | ICD-10-CM | POA: Insufficient documentation

## 2021-11-10 DIAGNOSIS — Z7982 Long term (current) use of aspirin: Secondary | ICD-10-CM | POA: Insufficient documentation

## 2021-11-10 DIAGNOSIS — M10071 Idiopathic gout, right ankle and foot: Secondary | ICD-10-CM | POA: Diagnosis not present

## 2021-11-10 DIAGNOSIS — Z7951 Long term (current) use of inhaled steroids: Secondary | ICD-10-CM | POA: Diagnosis not present

## 2021-11-10 DIAGNOSIS — Z87891 Personal history of nicotine dependence: Secondary | ICD-10-CM | POA: Insufficient documentation

## 2021-11-10 DIAGNOSIS — Z79899 Other long term (current) drug therapy: Secondary | ICD-10-CM | POA: Insufficient documentation

## 2021-11-10 DIAGNOSIS — M79671 Pain in right foot: Secondary | ICD-10-CM | POA: Diagnosis present

## 2021-11-10 DIAGNOSIS — J449 Chronic obstructive pulmonary disease, unspecified: Secondary | ICD-10-CM | POA: Diagnosis not present

## 2021-11-10 DIAGNOSIS — I10 Essential (primary) hypertension: Secondary | ICD-10-CM | POA: Diagnosis not present

## 2021-11-10 LAB — CBC
HCT: 40.8 % (ref 39.0–52.0)
Hemoglobin: 13.6 g/dL (ref 13.0–17.0)
MCH: 30.4 pg (ref 26.0–34.0)
MCHC: 33.3 g/dL (ref 30.0–36.0)
MCV: 91.3 fL (ref 80.0–100.0)
Platelets: 230 10*3/uL (ref 150–400)
RBC: 4.47 MIL/uL (ref 4.22–5.81)
RDW: 12.8 % (ref 11.5–15.5)
WBC: 8.6 10*3/uL (ref 4.0–10.5)
nRBC: 0 % (ref 0.0–0.2)

## 2021-11-10 LAB — BASIC METABOLIC PANEL
Anion gap: 9 (ref 5–15)
BUN: 13 mg/dL (ref 8–23)
CO2: 27 mmol/L (ref 22–32)
Calcium: 9.2 mg/dL (ref 8.9–10.3)
Chloride: 99 mmol/L (ref 98–111)
Creatinine, Ser: 0.87 mg/dL (ref 0.61–1.24)
GFR, Estimated: >60 mL/min (ref >60)
Glucose, Bld: 115 mg/dL — ABNORMAL HIGH (ref 70–99)
Potassium: 3.6 mmol/L (ref 3.5–5.1)
Sodium: 135 mmol/L (ref 135–145)

## 2021-11-10 LAB — URIC ACID: Uric Acid, Serum: 8.5 mg/dL (ref 3.7–8.6)

## 2021-11-10 NOTE — ED Triage Notes (Signed)
Pt treated 3 weeks ago for gout, never did a follow up. Was given 3 pills at visit.  Foot continues to hurt.

## 2021-11-11 ENCOUNTER — Emergency Department
Admission: EM | Admit: 2021-11-11 | Discharge: 2021-11-11 | Disposition: A | Discharge status: Home / Self Care | Payer: Medicare Other | Attending: Emergency Medicine | Admitting: Emergency Medicine

## 2021-11-11 DIAGNOSIS — M109 Gout, unspecified: Secondary | ICD-10-CM

## 2021-11-11 DIAGNOSIS — M79673 Pain in unspecified foot: Secondary | ICD-10-CM

## 2021-11-11 DIAGNOSIS — M10071 Idiopathic gout, right ankle and foot: Secondary | ICD-10-CM | POA: Diagnosis not present

## 2021-11-11 MED ORDER — COLCHICINE 0.6 MG PO TABS: 0.6000 mg | ORAL_TABLET | Freq: Once | ORAL | 0 refills | Status: DC

## 2021-11-11 MED ORDER — PREDNISONE 20 MG PO TABS
60.0000 mg | ORAL_TABLET | ORAL | Status: AC
  Administered 2021-11-11: 60 mg via ORAL
  Filled 2021-11-11: qty 3

## 2021-11-11 MED ORDER — COLCHICINE 0.6 MG PO TABS
0.6000 mg | ORAL_TABLET | Freq: Once | ORAL | Status: AC
  Administered 2021-11-11: 0.6 mg via ORAL
  Filled 2021-11-11: qty 1

## 2021-11-11 MED ORDER — PREDNISONE 10 MG PO TABS
ORAL_TABLET | ORAL | 0 refills | Status: DC
Start: 2021-11-11 — End: 2024-03-10

## 2021-11-11 MED ORDER — COLCHICINE 0.6 MG PO TABS
1.2000 mg | ORAL_TABLET | ORAL | Status: AC
  Administered 2021-11-11: 1.2 mg via ORAL
  Filled 2021-11-11: qty 2

## 2021-11-11 NOTE — ED Provider Notes (Signed)
[facility] Emergency Department Provider Note  ____________________________________________   Event Date/Time   First MD Initiated Contact with Patient 11/11/21 0258     (approximate)  I have reviewed the triage vital signs and the nursing notes.   HISTORY  Chief Complaint Foot Pain (R foot pain via ems from home)    HPI Anthony Meyers is a 75 y.o. male with medical history as listed below who presents for evaluation of swelling and pain in his right foot.  He said it feels like it has in the past when he was told he has gout.  Last time this happened he was given "3 pills" which made it feel better (he believes it might of been colchicine).  Walking on it makes it worse and nothing in particular makes it better other than rest.  He has not seen his regular doctor recently.  He last saw an urgent care doctor at [facility],.  He denies recent fever, chills, chest pain, shortness of breath, nausea, vomiting, and abdominal pain.  Pain can be severe at times.  No history of blood clots.     Past Medical History:  Diagnosis Date   Asthma    BPH (benign prostatic hypertrophy)    Brain aneurysm    Chronic prostatitis    Elevated PSA    GERD (gastroesophageal reflux disease)    HTN (hypertension)     Patient Active Problem List   Diagnosis Date Noted   Acute pancreatitis 05/23/2017   HTN (hypertension) 05/23/2017   Pancreatitis 05/23/2017   BPH with obstruction/lower urinary tract symptoms 03/13/2016   Elevated PSA 03/13/2016   Chronic obstructive pulmonary disease ([place]) 09/13/2015   GERD (gastroesophageal reflux disease) 09/13/2015    Past Surgical History:  Procedure Laterality Date   BRAIN SURGERY     CHOLECYSTECTOMY N/A 05/26/2017   Procedure: LAPAROSCOPIC CHOLECYSTECTOMY WITH INTRAOPERATIVE CHOLANGIOGRAM;  Surgeon: Florene Glen, MD;  Location: [facility] ORS;  Service: General;  Laterality: N/A;   COLONOSCOPY WITH PROPOFOL N/A 12/04/2015    Procedure: COLONOSCOPY WITH PROPOFOL;  Surgeon: Lollie Sails, MD;  Location: [facility] ENDOSCOPY;  Service: Endoscopy;  Laterality: N/A;   LUNG SURGERY Left     Prior to Admission medications   Medication Sig Start Date End Date Taking? Authorizing Provider  colchicine 0.6 MG tablet Take 1 tablet (0.6 mg total) by mouth once for 1 dose. 11/11/21 11/11/21 Yes Hinda Kehr, MD  predniSONE (DELTASONE) 10 MG tablet Take 4 tabs (40 mg) PO x 3 days, then take 2 tabs (20 mg) PO x 3 days, then take 1 tab (10 mg) PO x 3 days, then take 1/2 tab (5 mg) PO x 4 days. 11/11/21  Yes Hinda Kehr, MD  aspirin EC 81 MG tablet Take 81 mg by mouth daily.    [provider]  budesonide-formoterol (SYMBICORT) 160-4.5 MCG/ACT inhaler Inhale 2 puffs into the lungs 2 (two) times daily.    [provider]  finasteride (PROSCAR) 5 MG tablet Take 1 tablet (5 mg total) by mouth daily. 04/21/18   Zara Council A, PA-C  hydrochlorothiazide (HYDRODIURIL) 25 MG tablet Take 25 mg by mouth daily.    [provider]  omeprazole (PRILOSEC) 20 MG capsule Take 1 capsule (20 mg total) by mouth daily. 05/29/17 05/29/18  Nicholes Mango, MD  potassium chloride SA (K-DUR,KLOR-CON) 20 MEQ tablet Take 20 mEq by mouth 2 (two) times daily.     [provider]  Umeclidinium Bromide (INCRUSE ELLIPTA) 62.5 MCG/INH AEPB  INHALE 1 PUFFS BY MOUTH ONCE DAILY 08/08/15   [provider]    Allergies Patient has no known allergies.  Family History  Problem Relation Age of Onset   Throat cancer Brother    Stroke Father    Diabetes Mellitus II Father    Hypertension Father    Prostate cancer Brother    Kidney disease Neg Hx    Kidney cancer Neg Hx    Bladder Cancer Neg Hx     Social History Social History   Tobacco Use   Smoking status: Former    Types: Cigarettes    Quit date: 09/13/1995    Years since quitting: 26.1   Smokeless tobacco: Never  Vaping Use   Vaping Use: Never used  Substance  Use Topics   Alcohol use: No    Alcohol/week: 0.0 standard drinks   Drug use: No    Review of Systems Constitutional: No fever/chills Eyes: No visual changes. ENT: No sore throat. Cardiovascular: Denies chest pain. Respiratory: Denies shortness of breath. Gastrointestinal: No abdominal pain.  No nausea, no vomiting.  No diarrhea.  No constipation. Genitourinary: Negative for dysuria. Musculoskeletal: Pain in right foot. Integumentary: Negative for rash. Neurological: Negative for headaches, focal weakness or numbness.   ____________________________________________   PHYSICAL EXAM:  VITAL SIGNS: ED Triage Vitals  Enc Vitals Group     BP 11/10/21 2038 (!) 144/76     Pulse Rate 11/10/21 2038 88     Resp 11/10/21 2038 18     Temp 11/10/21 2038 99.9 F (37.7 C)     Temp Source 11/10/21 2038 Oral     SpO2 11/10/21 2038 96 %     Weight 11/10/21 1931 86.2 kg (190 lb)     Height 11/10/21 1931 1.753 m (5\' 9" )     Head Circumference --      Peak Flow --      Pain Score 11/10/21 1931 10     Pain Loc --      Pain Edu? --      Excl. in [place]? --     Constitutional: Alert and oriented.  Eyes: Conjunctivae are normal.  Head: Atraumatic. Nose: No congestion/rhinnorhea. Mouth/Throat: Patient is wearing a mask. Neck: No stridor.  No meningeal signs.   Cardiovascular: Normal rate, regular rhythm. Good peripheral circulation. Respiratory: Normal respiratory effort.  No retractions. Gastrointestinal: Soft and nontender. No distention.  Musculoskeletal: The patient has some edema and erythema in the right foot.  He has evidence of podagra but the MTP of his great toe is not particularly tender at this time.  However the overall impression is not a cellulitis and he does not have any wounds that are open.  He has some chronic skin changes including what appears to be a chronic but stable wound on his anterior shin that he says has been there "a long time".  Overall generally reassuring exam  and consistent with gout. Neurologic:  Normal speech and language. No gross focal neurologic deficits are appreciated.  Skin:  Skin is warm, dry and intact.  See musculoskeletal section for additional details. Psychiatric: Mood and affect are normal. Speech and behavior are normal.  ____________________________________________   LABS (all labs ordered are listed, but only abnormal results are displayed)  Labs Reviewed  BASIC METABOLIC PANEL - Abnormal; Notable for the following components:      Result Value   Glucose, Bld 115 (*)    All other components within normal limits  CBC  URIC ACID  ____________________________________________   RADIOLOGY Ursula Alert, personally viewed and evaluated these images (plain radiographs) as part of my medical decision making, as well as reviewing the written report by the radiologist.  ED MD interpretation: Mild soft tissue swelling at metatarsophalangeal joint  Official radiology report(s): DG Foot Complete Right  Result Date: 11/10/2021 CLINICAL DATA:  Right foot gout EXAM: RIGHT FOOT COMPLETE - 3+ VIEW COMPARISON:  None. FINDINGS: There is no evidence of fracture or dislocation. Metatarsus primus varus and hallux valgus. There is no evidence of arthropathy or other focal bone abnormality. Mild soft tissue swelling at the first metatarsophalangeal joint, possibly due to bunion. IMPRESSION: 1. Mild soft tissue swelling at the first metatarsophalangeal joint, possibly due to bunion, though gout could also cause the swelling. No erosive change. 2. Metatarsus primus varus and hallux valgus. Electronically Signed   By: Ulyses Jarred M.D.   On: 11/10/2021 20:20    ____________________________________________   PROCEDURES   Procedure(s) performed (including Critical Care):  Procedures   ____________________________________________   INITIAL IMPRESSION / MDM / [place] / ED COURSE  As part of my medical decision making, I  reviewed the following data within the [place] notes reviewed and incorporated, Labs reviewed , Old chart reviewed, Radiograph reviewed , Notes from prior ED visits, and [place] Controlled Substance Database   Differential diagnosis includes, but is not limited to, gout, cellulitis, diabetic foot wound, necrotizing fasciitis, pseudogout.  Overall impression is that of gout and is similar to what he has had in the past and he responded to treatment.  He has not been on prophylactic colchicine.  Given his age I worry that NSAIDs might not be the best choice for him.  I am giving him an additional loading dose of 1.2 mg colchicine and giving him 0.6 mg to take 1 hour later.  I will also write a prescription for another 0.6 mg for him to take tomorrow in addition to a prednisone taper.  I am giving him information for follow-up with podiatry.  Patient understands and agrees with the plan.  I will give him a first dose of prednisone 60 mg here and then give him a 40 mg starting dose taper.  I gave my usual and customary return precautions.           ____________________________________________  FINAL CLINICAL IMPRESSION(S) / ED DIAGNOSES  Final diagnoses:  Foot pain  Acute gout of right foot, unspecified cause     MEDICATIONS GIVEN DURING THIS VISIT:  Medications  colchicine tablet 1.2 mg (has no administration in time range)  colchicine tablet 0.6 mg (has no administration in time range)  predniSONE (DELTASONE) tablet 60 mg (has no administration in time range)     ED Discharge Orders          Ordered    colchicine 0.6 MG tablet   Once        11/11/21 0509    predniSONE (DELTASONE) 10 MG tablet        11/11/21 1610             Note:  This document was prepared using Dragon voice recognition software and may include unintentional dictation errors.   Hinda Kehr, MD 11/11/21 [phone]

## 2021-11-11 NOTE — Discharge Instructions (Addendum)
You should hold your HCTZ (hydrochlorothiazide) for the next few days as medications like this (diuretics) can sometimes worsen gout.  Please follow-up with your regular doctor or with the podiatrist listed in this paperwork.  Please take your medication as prescribed.  Return to the emergency department if you develop new or worsening symptoms that concern you.

## 2021-11-11 NOTE — ED Notes (Signed)
Pt assisted to bathroom at this time.

## 2022-05-13 ENCOUNTER — Encounter: Payer: Self-pay | Admitting: Internal Medicine

## 2022-05-14 ENCOUNTER — Ambulatory Visit: Payer: Medicare Other | Admitting: Anesthesiology

## 2022-05-14 ENCOUNTER — Encounter: Payer: Self-pay | Admitting: Internal Medicine

## 2022-05-14 ENCOUNTER — Encounter
Admission: RE | Disposition: A | Discharge status: Home / Self Care | Payer: Self-pay | Source: Home / Self Care | Attending: Internal Medicine

## 2022-05-14 ENCOUNTER — Ambulatory Visit
Admission: RE | Admit: 2022-05-14 | Discharge: 2022-05-14 | Disposition: A | Discharge status: Home / Self Care | Payer: Medicare Other | Attending: Internal Medicine | Admitting: Internal Medicine

## 2022-05-14 DIAGNOSIS — J449 Chronic obstructive pulmonary disease, unspecified: Secondary | ICD-10-CM | POA: Diagnosis not present

## 2022-05-14 DIAGNOSIS — Z87891 Personal history of nicotine dependence: Secondary | ICD-10-CM | POA: Insufficient documentation

## 2022-05-14 DIAGNOSIS — Z1211 Encounter for screening for malignant neoplasm of colon: Secondary | ICD-10-CM | POA: Insufficient documentation

## 2022-05-14 DIAGNOSIS — Z86010 Personal history of colonic polyps: Secondary | ICD-10-CM | POA: Diagnosis not present

## 2022-05-14 DIAGNOSIS — K219 Gastro-esophageal reflux disease without esophagitis: Secondary | ICD-10-CM | POA: Diagnosis not present

## 2022-05-14 DIAGNOSIS — D125 Benign neoplasm of sigmoid colon: Secondary | ICD-10-CM | POA: Diagnosis not present

## 2022-05-14 DIAGNOSIS — K640 First degree hemorrhoids: Secondary | ICD-10-CM | POA: Insufficient documentation

## 2022-05-14 DIAGNOSIS — K5730 Diverticulosis of large intestine without perforation or abscess without bleeding: Secondary | ICD-10-CM | POA: Diagnosis not present

## 2022-05-14 DIAGNOSIS — I10 Essential (primary) hypertension: Secondary | ICD-10-CM | POA: Insufficient documentation

## 2022-05-14 DIAGNOSIS — N400 Enlarged prostate without lower urinary tract symptoms: Secondary | ICD-10-CM | POA: Insufficient documentation

## 2022-05-14 HISTORY — DX: Chronic obstructive pulmonary disease, unspecified: J44.9

## 2022-05-14 HISTORY — PX: COLONOSCOPY: SHX5424

## 2022-05-14 HISTORY — DX: Gout, unspecified: M10.9

## 2022-05-14 SURGERY — COLONOSCOPY
Anesthesia: General

## 2022-05-14 MED ORDER — PROPOFOL 10 MG/ML IV BOLUS
INTRAVENOUS | Status: DC | PRN
Start: 2022-05-14 — End: 2022-05-14
  Administered 2022-05-14: 140 ug/kg/min via INTRAVENOUS
  Administered 2022-05-14: 100 mg via INTRAVENOUS

## 2022-05-14 MED ORDER — SODIUM CHLORIDE 0.9 % IV SOLN: INTRAVENOUS | Status: DC

## 2022-05-14 MED ORDER — LIDOCAINE HCL (CARDIAC) PF 100 MG/5ML IV SOSY
PREFILLED_SYRINGE | INTRAVENOUS | Status: DC | PRN
  Administered 2022-05-14: 100 mg via INTRAVENOUS

## 2022-05-14 MED ORDER — PROPOFOL 10 MG/ML IV BOLUS
INTRAVENOUS | Status: AC
  Filled 2022-05-14: qty 20

## 2022-05-14 NOTE — H&P (Signed)
   Outpatient short stay form Pre-procedure 05/14/2022 3:56 PM Lodema Parma K. Alice Reichert, M.D.  Primary Physician: [place] Pink, M.D.  Reason for visit:  Personal history of adenomatous colon polyps  History of present illness:                            Patient presents for colonoscopy for a personal hx of colon polyps. The patient denies abdominal pain, abnormal weight loss or rectal bleeding.      Current Facility-Administered Medications:    0.9 %  sodium chloride infusion, , Intravenous, Continuous, [place], Benay Pike, MD, Last Rate: 20 mL/hr at 05/14/22 1359, New Bag at 05/14/22 1359  Medications Prior to Admission  Medication Sig Dispense Refill Last Dose   aspirin EC 81 MG tablet Take 81 mg by mouth daily.   05/13/2022   budesonide-formoterol (SYMBICORT) 160-4.5 MCG/ACT inhaler Inhale 2 puffs into the lungs 2 (two) times daily.   05/13/2022   finasteride (PROSCAR) 5 MG tablet Take 1 tablet (5 mg total) by mouth daily. 90 tablet 3 05/13/2022   hydrochlorothiazide (HYDRODIURIL) 25 MG tablet Take 25 mg by mouth daily.   05/13/2022   potassium chloride SA (K-DUR,KLOR-CON) 20 MEQ tablet Take 20 mEq by mouth 2 (two) times daily.    05/13/2022   umeclidinium bromide (INCRUSE ELLIPTA) 62.5 MCG/INH AEPB INHALE 1 PUFFS BY MOUTH ONCE DAILY   05/13/2022   colchicine 0.6 MG tablet Take 1 tablet (0.6 mg total) by mouth once for 1 dose. 1 tablet 0    omeprazole (PRILOSEC) 20 MG capsule Take 1 capsule (20 mg total) by mouth daily. 30 capsule 1    predniSONE (DELTASONE) 10 MG tablet Take 4 tabs (40 mg) PO x 3 days, then take 2 tabs (20 mg) PO x 3 days, then take 1 tab (10 mg) PO x 3 days, then take 1/2 tab (5 mg) PO x 4 days. (Patient not taking: Reported on 05/14/2022) 23 tablet 0 Completed Course     No Known Allergies   Past Medical History:  Diagnosis Date   Asthma    BPH (benign prostatic hypertrophy)    Brain aneurysm    Chronic prostatitis    COPD (chronic obstructive pulmonary disease) (HCC)     Elevated PSA    GERD (gastroesophageal reflux disease)    Gout    HTN (hypertension)     Review of systems:  Otherwise negative.    Physical Exam  Gen: Alert, oriented. Appears stated age.  HEENT: [place]/AT. PERRLA. Lungs: CTA, no wheezes. CV: RR nl S1, S2. Abd: soft, benign, no masses. BS+ Ext: No edema. Pulses 2+    Planned procedures: Proceed with colonoscopy. The patient understands the nature of the planned procedure, indications, risks, alternatives and potential complications including but not limited to bleeding, infection, perforation, damage to internal organs and possible oversedation/side effects from anesthesia. The patient agrees and gives consent to proceed.  Please refer to procedure notes for findings, recommendations and patient disposition/instructions.     Shunsuke Granzow K. Alice Reichert, M.D. Gastroenterology 05/14/2022  3:56 PM

## 2022-05-14 NOTE — Transfer of Care (Signed)
Immediate Anesthesia Transfer of Care Note  Patient: Anthony Meyers  Procedure(s) Performed: COLONOSCOPY  Patient Location: PACU  Anesthesia Type:General  Level of Consciousness: drowsy  Airway & Oxygen Therapy: Patient Spontanous Breathing  Post-op Assessment: Report given to RN and Post -op Vital signs reviewed and stable  Post vital signs: Reviewed and stable  Last Vitals:  Vitals Value Taken Time  BP 126/76 05/14/22 1629  Temp 36.3 C 05/14/22 1627  Pulse 68 05/14/22 1630  Resp 12 05/14/22 1630  SpO2 96 % 05/14/22 1630  Vitals shown include unvalidated device data.  Last Pain:  Vitals:   05/14/22 1627  TempSrc: Temporal  PainSc: Asleep         Complications: No notable events documented.

## 2022-05-14 NOTE — Anesthesia Preprocedure Evaluation (Signed)
Anesthesia Evaluation  Patient identified by MRN, date of birth, ID band Patient awake    Reviewed: Allergy & Precautions, NPO status , Patient's Chart, lab work & pertinent test results  History of Anesthesia Complications Negative for: history of anesthetic complications  Airway Mallampati: III   Neck ROM: Full    Dental  (+) Missing, Chipped   Pulmonary asthma , COPD, former smoker (quit 25 years ago),    Pulmonary exam normal breath sounds clear to auscultation       Cardiovascular hypertension, Normal cardiovascular exam Rhythm:Regular Rate:Normal     Neuro/Psych negative neurological ROS     GI/Hepatic GERD  ,  Endo/Other  negative endocrine ROS  Renal/GU negative Renal ROS   BPH    Musculoskeletal   Abdominal   Peds  Hematology negative hematology ROS (+)   Anesthesia Other Findings   Reproductive/Obstetrics                             Anesthesia Physical Anesthesia Plan  ASA: 2  Anesthesia Plan: General   Post-op Pain Management:    Induction: Intravenous  PONV Risk Score and Plan: 2 and Propofol infusion, TIVA and Treatment may vary due to age or medical condition  Airway Management Planned: Natural Airway  Additional Equipment:   Intra-op Plan:   Post-operative Plan:   Informed Consent: I have reviewed the patients History and Physical, chart, labs and discussed the procedure including the risks, benefits and alternatives for the proposed anesthesia with the patient or authorized representative who has indicated his/her understanding and acceptance.       Plan Discussed with: CRNA  Anesthesia Plan Comments: (LMA/GETA backup discussed.  Patient consented for risks of anesthesia including but not limited to:  - adverse reactions to medications - damage to eyes, teeth, lips or other oral mucosa - nerve damage due to positioning  - sore throat or  hoarseness - damage to heart, brain, nerves, lungs, other parts of body or loss of life  Informed patient about role of CRNA in peri- and intra-operative care.  Patient voiced understanding.)        Anesthesia Quick Evaluation

## 2022-05-14 NOTE — Interval H&P Note (Signed)
History and Physical Interval Note:  05/14/2022 3:59 PM  Anthony Meyers  has presented today for surgery, with the diagnosis of Hx of adenomatous polyp of colon (Z86.010).  The various methods of treatment have been discussed with the patient and family. After consideration of risks, benefits and other options for treatment, the patient has consented to  Procedure(s): COLONOSCOPY (N/A) as a surgical intervention.  The patient's history has been reviewed, patient examined, no change in status, stable for surgery.  I have reviewed the patient's chart and labs.  Questions were answered to the patient's satisfaction.     [place], [place]

## 2022-05-14 NOTE — Op Note (Signed)
[facility] Gastroenterology Patient Name: Anthony Meyers Procedure Date: 05/14/2022 3:51 PM MRN: [id number] Account #: [id number] Date of Birth: 06-09-1946 Admit Type: Outpatient Age: 76 Room: [facility] ENDO ROOM 2 Gender: Male Note Status: Finalized Instrument Name: Jasper Riling [id number] Procedure:             Colonoscopy Indications:           High risk colon cancer surveillance: Personal history                         of non-advanced adenoma Providers:             Benay Pike. Alice Reichert MD, MD Referring MD:          Vicenta Aly (Referring MD) Medicines:             Propofol per Anesthesia Complications:         No immediate complications. Procedure:             Pre-Anesthesia Assessment:                        - The risks and benefits of the procedure and the                         sedation options and risks were discussed with the                         patient. All questions were answered and informed                         consent was obtained.                        - Patient identification and proposed procedure were                         verified prior to the procedure by the nurse. The                         procedure was verified in the procedure room.                        - ASA Grade Assessment: III - A patient with severe                         systemic disease.                        - After reviewing the risks and benefits, the patient                         was deemed in satisfactory condition to undergo the                         procedure.                        After obtaining informed consent, the colonoscope was                         passed under direct vision.  Throughout the procedure,                         the patient's blood pressure, pulse, and oxygen                         saturations were monitored continuously. The                         Colonoscope was introduced through the anus and                         advanced to the  the cecum, identified by appendiceal                         orifice and ileocecal valve. The colonoscopy was                         technically difficult and complex due to restricted                         mobility of the colon and a redundant colon.                         Successful completion of the procedure was aided by                         applying abdominal pressure. The patient tolerated the                         procedure fairly well. The quality of the bowel                         preparation was adequate. Findings:      The perianal and digital rectal examinations were normal. Pertinent       negatives include normal sphincter tone and no palpable rectal lesions.      Non-bleeding internal hemorrhoids were found during retroflexion. The       hemorrhoids were Grade I (internal hemorrhoids that do not prolapse).      Many small and large-mouthed diverticula were found in the sigmoid colon.      A 5 mm polyp was found in the sigmoid colon. The polyp was sessile. The       polyp was removed with a jumbo cold forceps. Resection and retrieval       were complete.      The exam was otherwise without abnormality. Impression:            - Non-bleeding internal hemorrhoids.                        - Diverticulosis in the sigmoid colon.                        - One 5 mm polyp in the sigmoid colon, removed with a                         jumbo cold forceps. Resected and retrieved.                        -  The examination was otherwise normal. Recommendation:        - Patient has a contact number available for                         emergencies. The signs and symptoms of potential                         delayed complications were discussed with the patient.                         Return to normal activities tomorrow. Written                         discharge instructions were provided to the patient.                        - Resume previous diet.                        -  Continue present medications.                        - If polyps are benign or adenomatous without                         dysplasia, I will advise NO further colonoscopy due to                         advanced age and/or severe comorbidity.                        - Return to GI office PRN.                        - The findings and recommendations were discussed with                         the patient. Procedure Code(s):     --- Professional ---                        [phone], Colonoscopy, flexible; with biopsy, single or                         multiple Diagnosis Code(s):     --- Professional ---                        K57.30, Diverticulosis of large intestine without                         perforation or abscess without bleeding                        K63.5, Polyp of colon                        K64.0, First degree hemorrhoids                        Z86.010, Personal history of colonic polyps CPT copyright 2019 American Medical Association. All rights  reserved. The codes documented in this report are preliminary and upon coder review may  be revised to meet current compliance requirements. Efrain Sella MD, MD 05/14/2022 4:29:22 PM This report has been signed electronically. Number of Addenda: 0 Note Initiated On: 05/14/2022 3:51 PM Scope Withdrawal Time: 0 hours 8 minutes 23 seconds  Total Procedure Duration: 0 hours 17 minutes 59 seconds  Estimated Blood Loss:  Estimated blood loss: none.      [facility]

## 2022-05-14 NOTE — Anesthesia Postprocedure Evaluation (Signed)
Anesthesia Post Note  Patient: Anthony Meyers  Procedure(s) Performed: COLONOSCOPY  Patient location during evaluation: Endoscopy Anesthesia Type: General Level of consciousness: awake and alert Pain management: pain level controlled Vital Signs Assessment: post-procedure vital signs reviewed and stable Respiratory status: spontaneous breathing, nonlabored ventilation, respiratory function stable and patient connected to nasal cannula oxygen Cardiovascular status: blood pressure returned to baseline and stable Postop Assessment: no apparent nausea or vomiting Anesthetic complications: no   No notable events documented.   Last Vitals:  Vitals:   05/14/22 1627 05/14/22 1649  BP: 126/76 (!) 156/78  Pulse: 69   Resp: 13   Temp: (!) 36.3 C   SpO2: 96%     Last Pain:  Vitals:   05/14/22 1649  TempSrc:   PainSc: 0-No pain                 Precious Haws Reubin Bushnell

## 2022-05-15 ENCOUNTER — Encounter: Payer: Self-pay | Admitting: Internal Medicine

## 2022-05-16 LAB — SURGICAL PATHOLOGY

## 2023-07-21 IMAGING — CR DG FOOT COMPLETE 3+V*R*
1 series · 3 of 3 positions shown · non-contrast
Comparison: None.

CLINICAL DATA: Right foot gout

EXAM:
RIGHT FOOT COMPLETE - 3+ VIEW

[Series 1: dg foot complete right · 0.14mm/px · 3 of 3 slices shown]
[im 1/3]
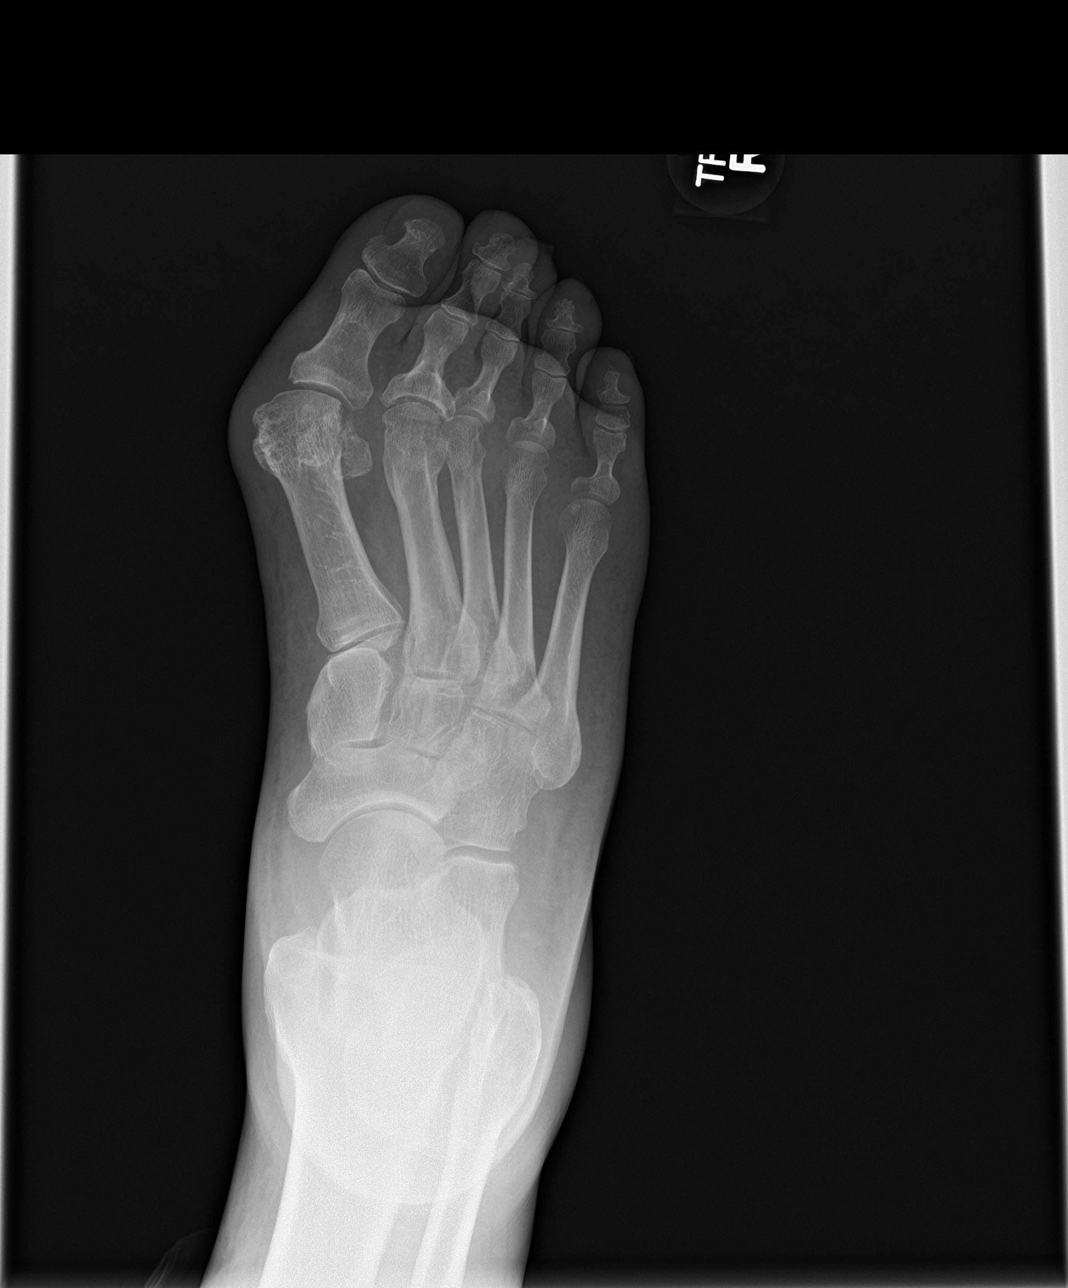
[im 2/3]
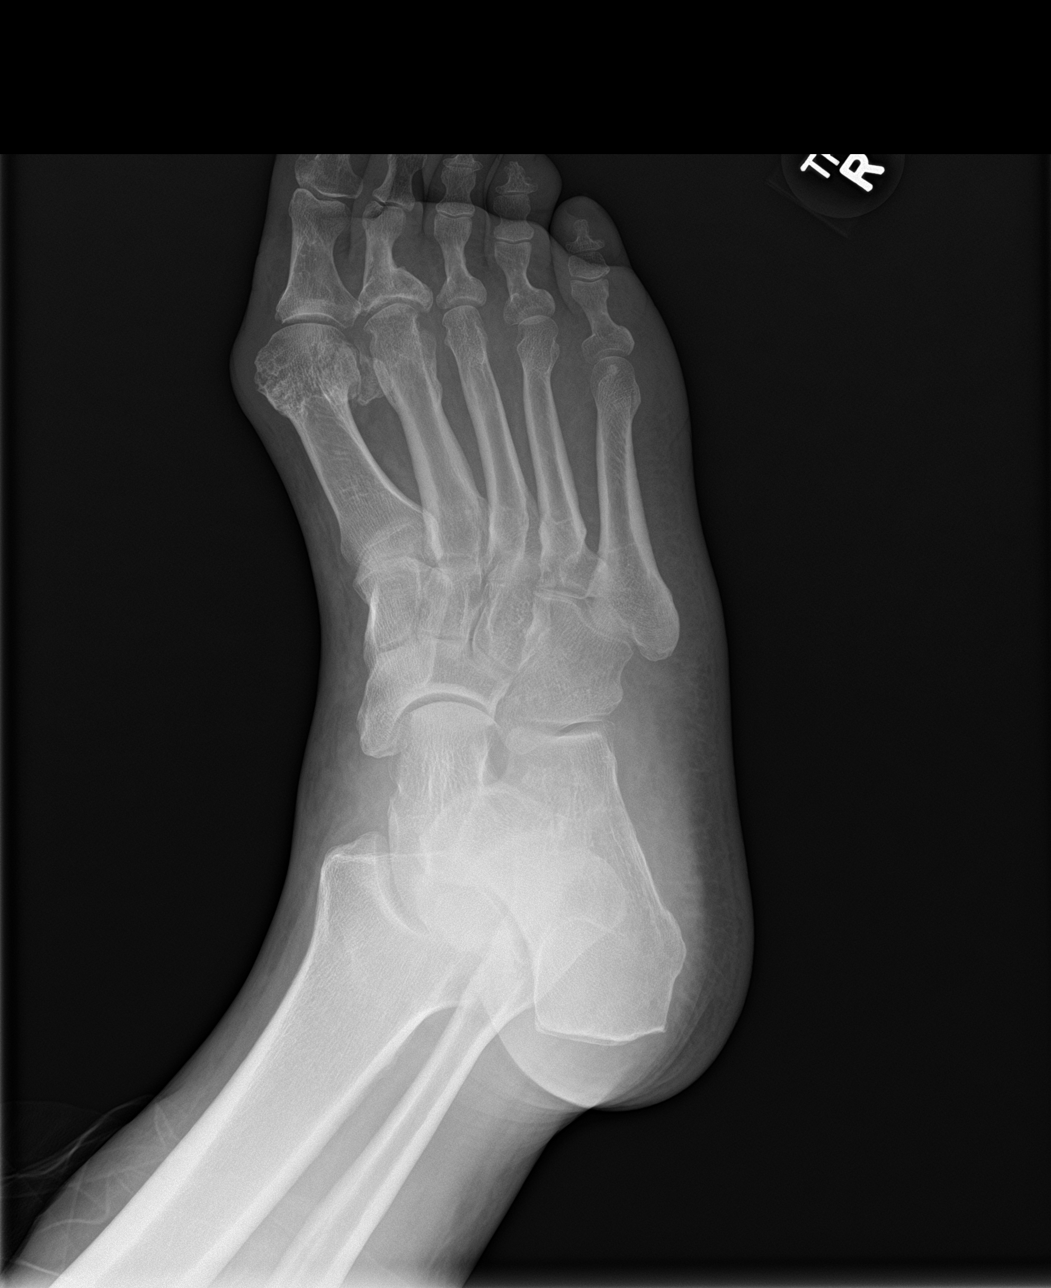
[im 3/3]
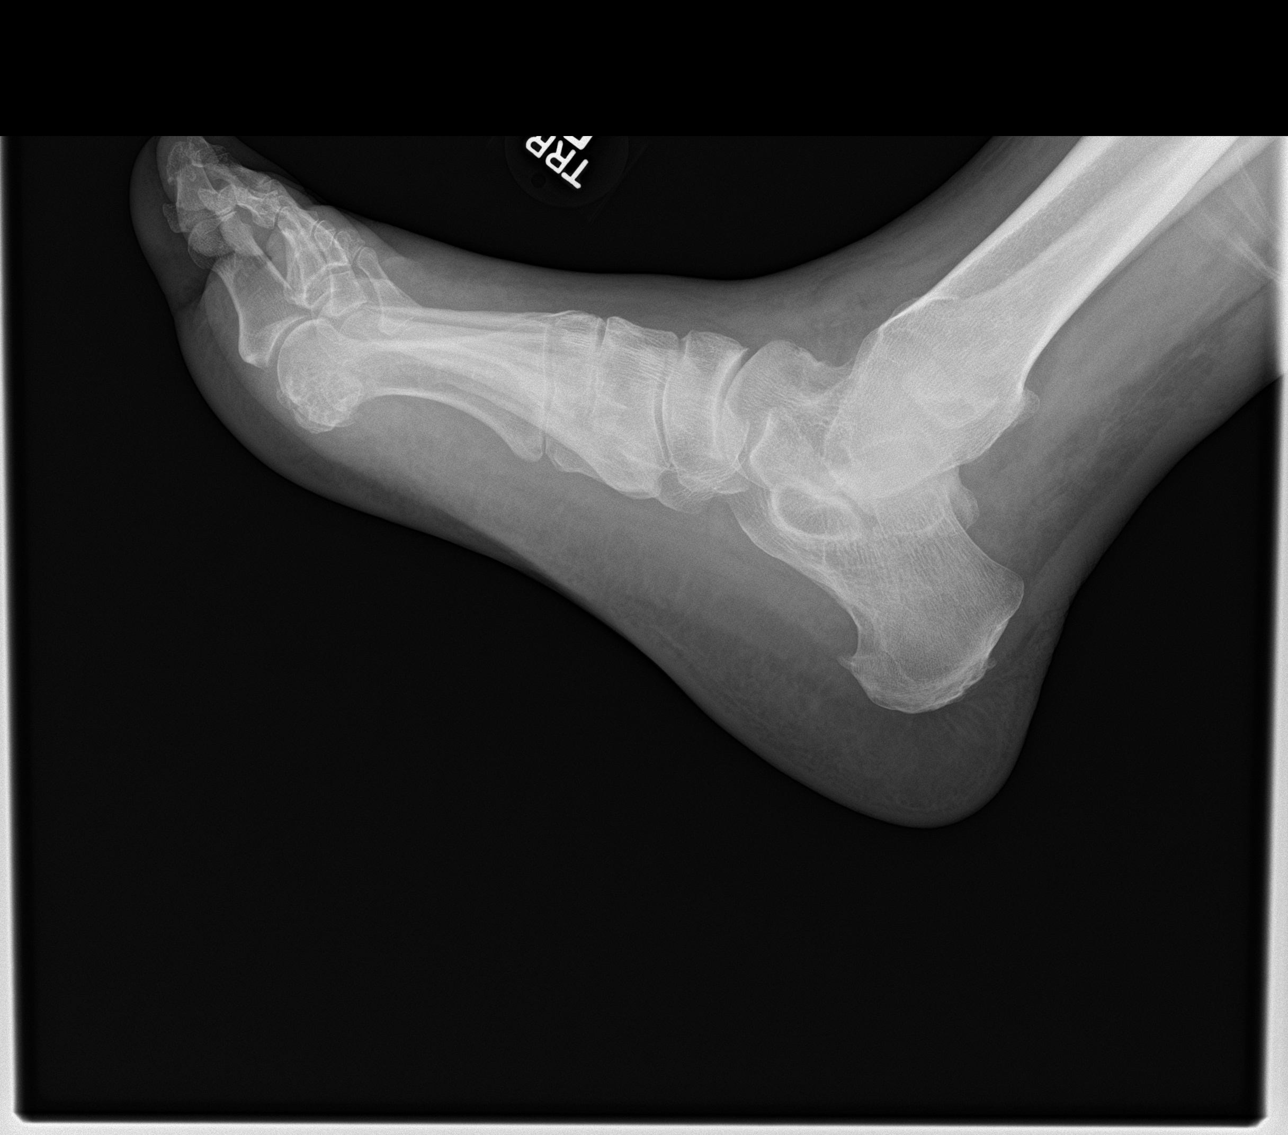

[3 of 3 positions shown; findings below may reference images not displayed]

FINDINGS: There is no evidence of fracture or dislocation. Metatarsus primus
varus and hallux valgus. There is no evidence of arthropathy or
other focal bone abnormality. Mild soft tissue swelling at the first
metatarsophalangeal joint, possibly due to bunion.
IMPRESSION: 1. Mild soft tissue swelling at the first metatarsophalangeal joint,
possibly due to bunion, though gout could also cause the swelling.
No erosive change.
2. Metatarsus primus varus and hallux valgus.

## 2024-03-10 ENCOUNTER — Encounter: Payer: Self-pay | Admitting: Emergency Medicine

## 2024-03-10 ENCOUNTER — Other Ambulatory Visit: Payer: Self-pay

## 2024-03-10 ENCOUNTER — Emergency Department

## 2024-03-10 ENCOUNTER — Emergency Department
Admission: EM | Admit: 2024-03-10 | Discharge: 2024-03-10 | Disposition: A | Discharge status: Home / Self Care | Attending: Emergency Medicine | Admitting: Emergency Medicine

## 2024-03-10 DIAGNOSIS — M19031 Primary osteoarthritis, right wrist: Secondary | ICD-10-CM | POA: Diagnosis not present

## 2024-03-10 DIAGNOSIS — M25531 Pain in right wrist: Secondary | ICD-10-CM

## 2024-03-10 MED ORDER — KETOROLAC TROMETHAMINE 15 MG/ML IJ SOLN
15.0000 mg | Freq: Once | INTRAMUSCULAR | Status: AC
  Administered 2024-03-10: 15 mg via INTRAVENOUS
  Filled 2024-03-10: qty 1

## 2024-03-10 MED ORDER — MELOXICAM 15 MG PO TABS: 15.0000 mg | ORAL_TABLET | Freq: Every day | ORAL | 0 refills | Status: AC

## 2024-03-10 NOTE — Discharge Instructions (Addendum)
 Call make appointment with your primary care provider if any continued problems or concerns.  A prescription for meloxicam was sent to the pharmacy for you to begin taking.  This medication is 1 time a day and to be taken with food.

## 2024-03-10 NOTE — ED Provider Notes (Signed)
 [facility] Provider Note    Event Date/Time   First MD Initiated Contact with Patient 03/10/24 1001     (approximate)   History   Wrist Pain   HPI  Anthony Meyers is a 78 y.o. male   presents to the ED via EMS with complaint of right wrist pain without history of injury.  Patient states that his wrist has been hurting for the last 5 days and has had some swelling.  No over-the-counter medications been taken.  Patient has a history of gout but denies being on colchicine at this time.      Physical Exam   Triage Vital Signs: ED Triage Vitals  Encounter Vitals Group     BP 03/10/24 0949 127/71     Systolic BP Percentile --      Diastolic BP Percentile --      Pulse Rate 03/10/24 0949 83     Resp 03/10/24 0949 17     Temp 03/10/24 0949 98.2 F (36.8 C)     Temp Source 03/10/24 0949 Oral     SpO2 03/10/24 0949 99 %     Weight 03/10/24 0952 180 lb (81.6 kg)     Height 03/10/24 0952 5\' 9"  (1.753 m)     Head Circumference --      Peak Flow --      Pain Score 03/10/24 0952 7     Pain Loc --      Pain Education --      Exclude from Growth Chart --     Most recent vital signs: Vitals:   03/10/24 0949  BP: 127/71  Pulse: 83  Resp: 17  Temp: 98.2 F (36.8 C)  SpO2: 99%     General: Awake, no distress.  CV:  Good peripheral perfusion.  Heart regular rate and rhythm. Resp:  Normal effort.  Lungs clear bilaterally. Abd:  No distention.  Other:  Right wrist with degenerative appearance.  Generalized tenderness on palpation.  No warmth or redness is appreciated.  Radial pulse present.  Patient is able move digits distally.  Skin is intact.  Range of motion is slow and guarded secondary to increased pain.   ED Results / Procedures / Treatments   Labs (all labs ordered are listed, but only abnormal results are displayed) Labs Reviewed - No data to display    RADIOLOGY X-ray images were reviewed and interpreted by myself independent of the  radiologist and no fracture or dislocation is noted.  Degenerative changes noted.    PROCEDURES:  Critical Care performed:   Procedures   MEDICATIONS ORDERED IN ED: Medications  ketorolac (TORADOL) 15 MG/ML injection 15 mg (15 mg Intravenous Given 03/10/24 1132)     IMPRESSION / MDM / ASSESSMENT AND PLAN / ED COURSE  I reviewed the triage vital signs and the nursing notes.   Differential diagnosis includes, but is not limited to, osteoarthritis, gout, subluxation, fracture, septic joint.  78 year old male presents to the ED via EMS with complaint of right wrist pain for the last 5 days.  He denies any injury.  X-rays show degenerative changes but no acute injury.  Patient was given Toradol 15 mg IM while in the ED and a prescription for meloxicam was sent to the pharmacy for him to begin taking 1 daily with food.  He is to follow-up with his PCP if any continued problems.      Patient's presentation is most consistent with acute complicated illness / injury requiring  diagnostic workup.  FINAL CLINICAL IMPRESSION(S) / ED DIAGNOSES   Final diagnoses:  Acute pain of right wrist  Osteoarthritis of right wrist, unspecified osteoarthritis type     Rx / DC Orders   ED Discharge Orders          Ordered    meloxicam (MOBIC) 15 MG tablet  Daily        03/10/24 1128             Note:  This document was prepared using Dragon voice recognition software and may include unintentional dictation errors.   Tommi Rumps, PA-C 03/10/24 1210    Sharyn Creamer, MD 03/11/24 [phone]

## 2024-03-10 NOTE — ED Notes (Signed)
 See triage note  Presents with swelling ro right hand  States he developed swelling about 5 days ago w/o injury

## 2024-03-10 NOTE — ED Triage Notes (Addendum)
 First Nurse Note: Pt via [facility] from home. Pt c/o R wrist pain states it has been hurting for the past 5 days. Report some swelling. Pt has a hx of gout. Denies any injuries. Pt is A&OX4 and NAD  115/69 BP 78 HR  19 RR  96% on RA  100 CBG  98.4 oral
